# Patient Record
Sex: Female | Born: 2000 | Race: Black or African American | Hispanic: No | Marital: Single | Smoking: Never smoker
Health system: Southern US, Community
[De-identification: ages and names within clinical notes are randomized; demographics above are authoritative.]

## PROBLEM LIST (undated history)

## (undated) DIAGNOSIS — E109 Type 1 diabetes mellitus without complications: Secondary | ICD-10-CM

## (undated) DIAGNOSIS — L309 Dermatitis, unspecified: Secondary | ICD-10-CM

## (undated) HISTORY — DX: Dermatitis, unspecified: L30.9

## (undated) HISTORY — PX: NO PAST SURGERIES: SHX2092

## (undated) HISTORY — DX: Type 1 diabetes mellitus without complications: E10.9

---
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
## 2019-01-11 ENCOUNTER — Ambulatory Visit (INDEPENDENT_AMBULATORY_CARE_PROVIDER_SITE_OTHER): Payer: Medicaid Other | Admitting: Pediatrics

## 2019-01-24 ENCOUNTER — Ambulatory Visit (INDEPENDENT_AMBULATORY_CARE_PROVIDER_SITE_OTHER): Payer: Medicaid Other | Admitting: Pediatrics

## 2019-02-16 ENCOUNTER — Ambulatory Visit (INDEPENDENT_AMBULATORY_CARE_PROVIDER_SITE_OTHER): Payer: Medicaid Other | Admitting: Pediatrics

## 2019-02-28 ENCOUNTER — Ambulatory Visit (INDEPENDENT_AMBULATORY_CARE_PROVIDER_SITE_OTHER): Payer: Medicaid Other | Admitting: Pediatrics

## 2019-02-28 ENCOUNTER — Encounter (INDEPENDENT_AMBULATORY_CARE_PROVIDER_SITE_OTHER): Payer: Self-pay | Admitting: Pediatrics

## 2019-02-28 VITALS — BP 112/64 | HR 72 | Ht 58.78 in | Wt 144.2 lb

## 2019-02-28 DIAGNOSIS — E65 Localized adiposity: Secondary | ICD-10-CM | POA: Diagnosis not present

## 2019-02-28 DIAGNOSIS — IMO0001 Reserved for inherently not codable concepts without codable children: Secondary | ICD-10-CM

## 2019-02-28 DIAGNOSIS — Z794 Long term (current) use of insulin: Secondary | ICD-10-CM

## 2019-02-28 DIAGNOSIS — E1065 Type 1 diabetes mellitus with hyperglycemia: Secondary | ICD-10-CM

## 2019-02-28 LAB — POCT GLYCOSYLATED HEMOGLOBIN (HGB A1C): Hemoglobin A1C: 7.8 % — AB (ref 4.0–5.6)

## 2019-02-28 LAB — POCT GLUCOSE (DEVICE FOR HOME USE): POC GLUCOSE: 263 mg/dL — AB (ref 70–99)

## 2019-02-28 NOTE — Patient Instructions (Addendum)
It was a pleasure to see you in clinic today.   Feel free to contact our office during normal business hours at [phone] with questions or concerns. If you need [place] urgently after normal business hours, please call the above number to reach our answering service who will contact the on-call pediatric endocrinologist.  If you choose to communicate with [place] via MyChart, please do not send urgent messages as this inbox is NOT monitored on nights or weekends.  Urgent concerns should be discussed with the on-call pediatric endocrinologist.  -Always have fast sugar with you in case of low blood sugar (glucose tabs, regular juice or soda, candy) -Always wear your ID that states you have diabetes -Always bring your meter/continuous glucose monitor to your visit -Call/Email if you want to review blood sugars   I will put in a referral to the adult endocrinologist- Dr. Carlus Pavlov  Dr. Carlus Pavlov Internal medicine - endocrinology, diabetes & metabolism  [address] [place], [place], [place] [address]  [phone] - [phone]  Increase tresiba to 27 units daily Increase novolog to 1 unit for every 8 grams carbs at all meals.

## 2019-02-28 NOTE — Progress Notes (Signed)
Pediatric Endocrinology Consultation Follow-Up Visit  Chloe Peters, Chloe Peters [birth year]  [name], MD   Chief Complaint: T1DM  HPI: Chloe Peters  is a 18 y.o. female presenting for follow-up of type 1 DM.  she attended this visit alone.    1. Chloe [facility] in 04/2018 for transfer of care for T1DM.  She was diagnosed with T1DM at age 66 years and reported several DKA admissions in the past.   2. Since last visit [facility] on 10/04/18, Chloe Peters has been well overall.  No ED visits or hospitalizations.  Concerns: -classes are more stressful this semester -BGs higher since going back to school, not seeing as many lows (except past 2 days) -Wants to transition to adult endocrine.  Plans to go on mom's insurance when she turns 18 and her medicaid expires  Insulin regimen:  Tresiba 25 units at [employer] 1 unit for every 9 g CHO at BF, 1:8 at lunch, 1:9 at dinner, 1 unit for every 30mg /dl of BG >758 [place] before meals BF: 5-6 units Novolog total L: 8 units Novolog total D: 5-6 units Novolog total Hypoglycemia: Able to feel most low blood sugars, none recently.  No glucagon needed recently.  Blood glucose download:  Using dexcom only  CGM download: Dexcom G6 Avg BG: 244 High 82% of the time, In range 18% of the time, low 0% of the time Patterns: Overnight starts around 300-400, gives correction every 2 hours, drops to 150s by 6AM and remains here until 9AM, then climbs to 250 for the remainder of the day  Injection sites: Legs, lateral abdomen (avoiding arms and bumps on abdomen) Annual labs due:  Due for annual screening labs 09/2019 Ophthalmology due: Appt scheduled in June.   ROS:  All systems reviewed with pertinent positives listed below; otherwise negative. Constitutional: Weight down 6lb, eating same, negative celiac panel in 04/2018.  Sleeping well HEENT: No vision problems  lately when wearing glasses Respiratory: No increased work of breathing currently GI: Hx of constipation.  Was seen by adult GI who performed a colonoscopy and reported irritation to started her on linzess in addition to miralax.  She has not noted any improvement since linzess. GU: Plans to see gyn soon to discuss birth control to improve cramps Musculoskeletal: No joint deformity Neuro: Normal affect Endocrine: As above  Past Medical History:  Past Medical History:  Diagnosis Date  . Type 1 diabetes (HCC)    Dx at age 66 years    Meds: Outpatient Encounter Medications as of 02/28/2019  Medication Sig  . ACCU-CHEK FASTCLIX LANCETS MISC Check sugar 10 x daily  . Clindamycin-Benzoyl Per, Refr, gel APPLY TO CLEAN AND DRY FACE EVERY MORNING  . Glucagon (BAQSIMI TWO PACK) 3 MG/DOSE POWD Place 1 application into the nose as needed. Use as directed if unconscious, unable to take food po, or having a seizure due to hypoglycemia  . glucagon 1 MG injection Inject 1 mg IM into thigh for severe Hypoglycemia and patient unconscious and cannot eat or drink  . glucose blood (ACCU-CHEK GUIDE) test strip Use to check BG 6 times daily  . hydrocortisone-pramoxine (ANALPRAM-HC) 2.5-1 % rectal cream APPLY TO AFFECTED AREA 3 TIMES A DAY AS NEEDED  . insulin aspart (NOVOLOG FLEXPEN) 100 UNIT/ML FlexPen Use as directed by MD, up to 50 units daily.  . insulin degludec (TRESIBA FLEXTOUCH) 100 UNIT/ML SOPN FlexTouch Pen Inject up to 50 units at bedtime and per care plan  .  Insulin Pen Needle (BD PEN NEEDLE NANO U/F) 32G X 4 MM MISC use with insulin pen to inject up to 6 times daily  . loratadine (CLARITIN) 10 MG tablet   . Polyethylene Glycol 3350 (PEG 3350) POWD Take by mouth.  . insulin aspart (NOVOLOG) 100 UNIT/ML injection Up to 300 units of insulin in insulin pump every 48-72 hours per DKA and Hyperglycemia protocols (Patient not taking: Reported on 02/28/2019)   No facility-administered encounter medications  on file as of 02/28/2019.     Allergies: Allergies  Allergen Reactions  . Shellfish Allergy Anaphylaxis   Surgical History: No past surgical history on file.  Hospitalized at age 74 for DKA with diabetes diagnosis Several prior hospitalizations for DKA, most recent 07/02/18  Family History:  Family History  Problem Relation Age of Onset  . Diabetes Paternal Aunt   . 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 Blood pressure percentiles are not available for patients who are 18 years or older.  Wt Readings from Last 3 Encounters:  02/28/19 144 lb 3.2 oz (65.4 kg) (78 %, Z= 0.76)*  10/04/18 150 lb 6.4 oz (68.2 kg) (84 %, Z= 0.99)*  07/05/18 156 lb 6.4 oz (70.9 kg) (88 %, Z= 1.18)*   * Growth percentiles are based on CDC (Girls, 2-20 Years) data.   Ht Readings from Last 3 Encounters:  02/28/19 4' 10.78" (1.493 m) (2 %, Z= -2.14)*  10/04/18 5' 2.8" (1.595 m) (29 %, Z= -0.57)*  07/05/18 5' 3.58" (1.615 m) (40 %, Z= -0.25)*   * Growth percentiles are based on CDC (Girls, 2-20 Years) data.   Body mass index is 29.34 kg/m.  78 %ile (Z= 0.76) based on CDC (Girls, 2-20 Years) weight-for-age data using vitals from 02/28/2019. 2 %ile (Z= -2.14) based on CDC (Girls, 2-20 Years) Stature-for-age data based on Stature recorded on 02/28/2019.  General: Well developed, well nourished female in no acute distress.  Appears stated age Head: Normocephalic, atraumatic.   Eyes:  Pupils equal and round. EOMI.   Sclera white.  No eye drainage.   Ears/Nose/Mouth/Throat: Nares patent, no nasal drainage.  Normal dentition, mucous membranes  moist.   Neck: supple, no cervical lymphadenopathy, no thyromegaly Cardiovascular: regular rate, normal S1/S2, no murmurs Respiratory: No increased work of breathing.  Lungs clear to auscultation bilaterally.  No wheezes. Abdomen: soft, nondistended, reports slightly uncomfortable feeling on palpation  Extremities: warm, well perfused, cap refill < 2 sec.   Musculoskeletal: Normal muscle mass.  Normal strength Skin: warm, dry.  No rash. 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 Range 05/24/2018 00:00  Total CHOL/HDL Ratio Latest Ref Range: <5.0 (calc) 3.4  Cholesterol Latest Ref Range: <170 mg/dL 793 (H)  HDL Cholesterol Latest Ref Range: >45 mg/dL 72  LDL Cholesterol (Calc) Latest Ref Range: <110 mg/dL (calc) 903 (H)  MICROALB/CREAT RATIO Latest Ref Range: <30 mcg/mg creat 60 (H)  Non-HDL Cholesterol (Calc) Latest Ref Range: <120 mg/dL (calc) 009 (H)  Triglycerides Latest Ref Range: <90 mg/dL 71  TSH Latest Units: mIU/L 0.58  T4,Free(Direct) Latest Ref Range: 0.8 - 1.4 ng/dL 1.1  Immunoglobulin A Latest Ref Range: 81 - 463 mg/dL 233  (tTG) Ab, IgA Latest Units: U/mL 3  Microalb, Ur Latest Units: mg/dL 2.9  Creatinine, Urine Latest Ref Range: 20 - 275 mg/dL 48     Ref. Range 10/04/2018 00:00  Total CHOL/HDL Ratio Latest Ref Range: <5.0 (calc) 2.8  Cholesterol Latest Ref Range: <170 mg/dL 007  HDL Cholesterol Latest Ref Range: >45 mg/dL 59  LDL Cholesterol (Calc) Latest Ref Range: <110  mg/dL (calc) 89  MICROALB/CREAT RATIO Latest Ref Range: <30 mcg/mg creat 8  Non-HDL Cholesterol (Calc) Latest Ref Range: <120 mg/dL (calc) 622  Triglycerides Latest Ref Range: <90 mg/dL 67  Microalb, Ur Latest Units: mg/dL 0.2  Creatinine, Urine Latest Ref Range: 20 - 275 mg/dL 26    Assessment/Plan: Chloe Peters is a 18 y.o. with Type 1 diabetes in good and improving control on an MDI and CGM regimen.  A1c has decreased since last visit though remains just above the ADA goal of <7.5%.  she would benefit from increased basal insulin and increased novolog at lunch and dinner.    When a patient is on insulin, intensive monitoring of blood glucose levels and continuous insulin titration is vital to avoid insulin toxicity leading to severe hypoglycemia. Severe hypoglycemia can lead to seizure or death. Hyperglycemia can also result from inadequate insulin dosing and can lead to ketosis requiring ICU admission and intravenous insulin.   1. Uncontrolled diabetes mellitus type 1 without complications (HCC) - POCT Glucose and POCT HgB A1C as above -Provided with my contact information and advised to email/send mychart with questions/need for BG review -Will refer to Dr. Elvera Lennox for transition of care.  Next appt needed in 3 months.  Advised to call my office if she needs anything prior to transfer to Dr. Elvera Lennox  2. Insulin dose change -Made the following insulin changes: Increase tresiba to 27 units daily Increase carb ratio to 1:8 at all meals  3. Lipohypertrophy -Avoid lipohypertrophied sites   Follow-up:  Return if symptoms worsen or fail to improve. Transfer care to adult endocrine  Level of Service: This visit lasted in excess of 25 minutes. More than 50% of the visit was devoted to counseling.  [name], MD

## 2019-03-06 ENCOUNTER — Other Ambulatory Visit (INDEPENDENT_AMBULATORY_CARE_PROVIDER_SITE_OTHER): Payer: Self-pay | Admitting: Pediatrics

## 2019-03-06 DIAGNOSIS — E1065 Type 1 diabetes mellitus with hyperglycemia: Principal | ICD-10-CM

## 2019-03-06 DIAGNOSIS — IMO0001 Reserved for inherently not codable concepts without codable children: Secondary | ICD-10-CM

## 2019-03-09 ENCOUNTER — Telehealth (INDEPENDENT_AMBULATORY_CARE_PROVIDER_SITE_OTHER): Payer: Self-pay | Admitting: Pediatrics

## 2019-03-09 NOTE — Telephone Encounter (Signed)
°  Who's calling (name and relationship to patient) : Adelma (Self)  Best contact number: [phone] Provider they see: Dr. Larinda Buttery  Reason for call: Pt would like a return call from clinic.

## 2019-03-10 ENCOUNTER — Telehealth (INDEPENDENT_AMBULATORY_CARE_PROVIDER_SITE_OTHER): Payer: Self-pay | Admitting: "Endocrinology

## 2019-03-10 NOTE — Telephone Encounter (Signed)
Spoke to patient, she advises she went to doctor yesterday and was diagnosed with Flu A. She would like to know how to respond to her high glucose levels. I advise I will send note to [facility]. She asks that he respond to her via Mychart.

## 2019-03-10 NOTE — Telephone Encounter (Signed)
1. Chloe Peters called. She has influenza A that was diagnosed yesterday. She want advice about how to control her high BGs 2. Subjective: She feels bad, but is able to eat and drink. She is not having any nausea, vomiting, or diarrhea. She has urine ketone strips, but has not used them yet. She has forgotten about the Sick Day protocol and the DKA protocol and can't fine the instructions sheets that we gave her previously She is currently taking [place] and Novolog. 3. 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 She may already have ketosis and ketonuria.  4. I reviewed the Sick Day protocol and the DKA protocol with her. I asked her to check her ketones and contact us if they seem to be increasing. I also asked her to contact us if things are not going well. If she develops severe nausea and vomiting, she will have to go to the [facility] ED and may need to be admitted.  Molli Knock

## 2019-03-10 NOTE — Telephone Encounter (Signed)
Dr. Homero Fellers patient.  Routed to Dr. Fransico Michael who is on call.

## 2019-08-09 ENCOUNTER — Other Ambulatory Visit (INDEPENDENT_AMBULATORY_CARE_PROVIDER_SITE_OTHER): Payer: Self-pay

## 2019-08-09 DIAGNOSIS — IMO0001 Reserved for inherently not codable concepts without codable children: Secondary | ICD-10-CM

## 2019-08-09 MED ORDER — BD PEN NEEDLE NANO U/F 32G X 4 MM MISC: 5 refills | Status: DC

## 2019-10-03 ENCOUNTER — Telehealth (INDEPENDENT_AMBULATORY_CARE_PROVIDER_SITE_OTHER): Payer: Self-pay | Admitting: Pediatrics

## 2019-10-03 NOTE — Telephone Encounter (Signed)
Received message in Epic that [place] received her flu shot in a [facility] store.

## 2019-10-30 ENCOUNTER — Other Ambulatory Visit: Payer: Self-pay

## 2019-11-01 ENCOUNTER — Ambulatory Visit: Payer: Medicaid Other | Admitting: Internal Medicine

## 2019-11-02 ENCOUNTER — Ambulatory Visit: Payer: Medicaid Other | Admitting: Internal Medicine

## 2019-11-02 NOTE — Progress Notes (Signed)
Patient ID: Chloe Peters, female   DOB: [birth year], 18 y.o.   MRN: [id number]   HPI: Chloe Peters is a 18 y.o.-year-old female, referred by her pediatric endocrinologist, Dr. Charna Archer, for management of DM1, uncontrolled, without complications.  Patient has been diagnosed with diabetes at 18 years old; she started on insulin at dx.   Last hemoglobin A1c was: Lab Results  Component Value Date   HGBA1C 7.8 (A) 02/28/2019   HGBA1C 8.3 (A) 10/04/2018   HGBA1C 9.2 (A) 05/24/2018   She is on: - Tresiba 27 units in a.m. - NovoLog: Insulin to carb ratio 1:8, target 150, insulin sensitivity factor 30  She checks her sugars more than 4 times a day with her Dexcom G6 CGM.  She also has an Accu-Chek meter.  CGM parameters: - Average from CGM: **  Time in range:  - very low (40-50):  - low (50-70):  - normal range (70-180):  - high sugars (180-250):  - very high sugars (250-400):   No lows. Lowest sugar was **; she has hypoglycemia awareness at 70. No previous hypoglycemia admission. ** a glucagon kit at home. Highest sugar was **. No recent DKA admissions, but had several in the past.  Pt's meals are: - Breakfast: - Lunch: - Dinner: - Snacks:  - no CKD, last BUN/creatinine:  No results found for: BUN, CREATININE   -No HL; last set of lipids: Lab Results  Component Value Date   CHOL 163 10/04/2018   HDL 59 10/04/2018   LDLCALC 89 10/04/2018   TRIG 67 10/04/2018   CHOLHDL 2.8 10/04/2018   - last eye exam was in **. No DR.  - no numbness and tingling in her feet.  No history of hypothyroidism.  Last TSH: Lab Results  Component Value Date   TSH 0.58 05/24/2018   She had a negative celiac panel in 04/2018.  Pt has FH of DM in **.  ROS: Constitutional: no weight gain, no weight loss, no fatigue, no subjective hyperthermia, no subjective hypothermia, no nocturia Eyes: no blurry vision, no xerophthalmia ENT: no sore throat, no nodules palpated in neck, no dysphagia, no  odynophagia, no hoarseness, no tinnitus, no hypoacusis Cardiovascular: no CP, no SOB, no palpitations, no leg swelling Respiratory: no cough, no SOB, no wheezing Gastrointestinal: no N, no V, no D, no C, no acid reflux Musculoskeletal: no muscle, no joint aches Skin: no rash, no hair loss Neurological: no tremors, no numbness or tingling/no dizziness/no HAs Psychiatric: no depression, no anxiety  PE: There were no vitals taken for this visit. Wt Readings from Last 3 Encounters:  02/28/19 144 lb 3.2 oz (65.4 kg) (78 %, Z= 0.76)*  10/04/18 150 lb 6.4 oz (68.2 kg) (84 %, Z= 0.99)*  07/05/18 156 lb 6.4 oz (70.9 kg) (88 %, Z= 1.18)*   * Growth percentiles are based on CDC (Girls, 2-20 Years) data.   Constitutional: normal weight, in NAD Eyes: PERRLA, EOMI, no exophthalmos ENT: moist mucous membranes, no thyromegaly, no cervical lymphadenopathy Cardiovascular: RRR, No MRG Respiratory: CTA B Gastrointestinal: abdomen soft, NT, ND, BS+ Musculoskeletal: no deformities, strength intact in all 4 Skin: moist, warm, no rashes Neurological: no tremor with outstretched hands, DTR normal in all 4  ASSESSMENT: 1. DM1, uncontrolled, without complications  PLAN:  1. Patient with long-standing, uncontrolled DM1, on basal-bolus insulin regimen.  Latest HbA1c was reviewed from 02/2019: 7.8%, improved.  At today's visit, HbA1c is **. - We discussed about changes to his insulin regimen, as  follows:  There are no Patient Instructions on file for this visit. - Strongly advised her to start checking sugars at different times of the day - check at least 4 times a day, rotating checks - given sugar log and advised how to fill it and to bring it at next appt  - given foot care handout and explained the principles  - given instructions for hypoglycemia management "15-15 rule"  - advised for yearly eye exams - sent glucagon kit Rx to pharmacy - advised to get ketone strips - advised to always have Glu  tablets with her - advised for a Med-alert bracelet mentioning "type 1 diabetes mellitus". - will refer to DM education for further help with the pump: carb counting check, basal rate validation, extended bolusing, sick days rules, etc. - given instruction Re: exercising and driving in DM1 (pt instructions) - no signs of other autoimmune disorders - Return to clinic in 1 mo with sugar log   - time spent with the patient: **, of which >50% was spent in obtaining information about herdisease, reviewing previous labs, office visit notes, hospitalization records, and DM treatments, counseling pt about her condition (please see the discussed topics above), and developing a plan to prevent further hypoglycemia and hyperglycemia. We also discussed about proper diet. Pt had a number of questions which I addressed.  Philemon Kingdom, MD PhD [facility]

## 2019-11-03 ENCOUNTER — Encounter: Payer: Self-pay | Admitting: Internal Medicine

## 2019-11-17 ENCOUNTER — Ambulatory Visit: Payer: Medicaid Other | Admitting: Internal Medicine

## 2019-12-05 ENCOUNTER — Encounter: Payer: Self-pay | Admitting: Internal Medicine

## 2019-12-08 ENCOUNTER — Ambulatory Visit: Payer: Medicaid Other | Admitting: Internal Medicine

## 2020-02-16 ENCOUNTER — Other Ambulatory Visit (INDEPENDENT_AMBULATORY_CARE_PROVIDER_SITE_OTHER): Payer: Self-pay | Admitting: Pediatrics

## 2020-03-24 ENCOUNTER — Other Ambulatory Visit (INDEPENDENT_AMBULATORY_CARE_PROVIDER_SITE_OTHER): Payer: Self-pay | Admitting: Pediatrics

## 2020-05-07 ENCOUNTER — Other Ambulatory Visit: Payer: Self-pay

## 2020-05-08 ENCOUNTER — Ambulatory Visit: Payer: Medicaid Other | Admitting: Nurse Practitioner

## 2020-09-04 ENCOUNTER — Ambulatory Visit (INDEPENDENT_AMBULATORY_CARE_PROVIDER_SITE_OTHER): Payer: No Typology Code available for payment source | Admitting: Allergy

## 2020-09-04 ENCOUNTER — Other Ambulatory Visit: Payer: Self-pay

## 2020-09-04 ENCOUNTER — Encounter: Payer: Self-pay | Admitting: Allergy

## 2020-09-04 VITALS — BP 118/72 | HR 93 | Temp 98.2°F | Resp 16 | Ht 63.5 in | Wt 135.8 lb

## 2020-09-04 DIAGNOSIS — T7800XD Anaphylactic reaction due to unspecified food, subsequent encounter: Secondary | ICD-10-CM

## 2020-09-04 DIAGNOSIS — J301 Allergic rhinitis due to pollen: Secondary | ICD-10-CM | POA: Diagnosis not present

## 2020-09-04 DIAGNOSIS — H1013 Acute atopic conjunctivitis, bilateral: Secondary | ICD-10-CM

## 2020-09-04 DIAGNOSIS — L2089 Other atopic dermatitis: Secondary | ICD-10-CM

## 2020-09-04 MED ORDER — OLOPATADINE HCL 0.2 % OP SOLN: OPHTHALMIC | 5 refills | Status: AC

## 2020-09-04 MED ORDER — AZELASTINE HCL 0.1 % NA SOLN: 2.0000 | Freq: Two times a day (BID) | NASAL | 5 refills | Status: DC | PRN

## 2020-09-04 MED ORDER — DESONIDE 0.05 % EX OINT: 1.0000 "application " | TOPICAL_OINTMENT | Freq: Two times a day (BID) | CUTANEOUS | 5 refills | Status: DC | PRN

## 2020-09-04 NOTE — Patient Instructions (Addendum)
Eczema, flexural  - Bathe and soak for 5-10 minutes in warm water once a day. Pat dry.  Immediately apply the below cream prescribed to flared areas (dry, itchy, patchy, irritated, red areas) only. Wait 5-10 minutes and then apply moisturizer like Cetaphil all over.  To affected areas on the face and neck, apply: . Desonide 0.05% ointment twice a day as needed. . Be careful to avoid the eyes.  -Make a note of any foods that make eczema worse. -Keep finger nails trimmed. - common food allergy skin testing is negative - use scent free and dye free soaps, body products and detergents  Environmental allergies - environmental allergy skin testing is positive to grass, weed and tree pollens - allergen avoidance measures discussed/handouts provided - continue Claritin 10mg  daily as needed.  If Claritin becomes less effective then can try either Zyrtec 10mg , Allegra 180mg  or Xyzal 5mg  daily as needed - for itchy/watery eyes can use Olopatadine 0.2% 1 drop each eye daily as needed - for nasal drainage and post-nasal drip recommend use of nasal antihistamine, Astelin 2 sprays each nostril 1-2 times a day as needed - if medication management is not effective then consider restarting allergen immunotherapy (allergy shots)  Food allergy  - skin testing to shellfish is positive to lobster and oyster  - continue avoidance of shellfish.  If you become interested in eating shellfish or seeing if you are not allergic anymore then would recommend obtain labwork for serum IgE to shellfish panel.   - have access to self-injectable epinephrine (Epipen or AuviQ) 0.3mg  at all times  - follow emergency action plan in case of allergic reaction  Follow-up in 4-6 months or sooner if needed

## 2020-09-04 NOTE — Progress Notes (Signed)
New Patient Note  RE: Chloe Peters MRN: [id number] DOB: [birth year] Date of Office Visit: 09/04/2020  Referring provider: Norm Salt, PA Primary care provider: Norm Salt, [place]  Chief Complaint: itchy rash  History of present illness: Chloe Peters is a 19 y.o. female presenting today for consultation for rash.  She has history of food allergy to shellfish.    She has been having a itchy rash on her neck that started on back of neck in July 2021.  She went to her PCP at that time and states was told it was dermatitis.  She states she would scratch and rash would spread.  Lately she has been seeing the rash on the front of her neck now.  She states the rash is itchy, dry and patchy.  She states her mother has told her she has had similar rash in the past.  She does take hydroxyzine now most nights to help with itch. She also uses hydrocortisone that also helps.  Moisturizes and baths with Cetaphil and Curology brand so.  Does not wear jewelry, does not use perfumes.  No change in hair products.  Rash is only on neck.    When she was around 19 years old she was eating crab and her throat started to get itchy and had difficulty breathing and vomiting. Has been avoiding shellfish since.  Eats fish without issue.  Has access to an epinephrine device; has never needed to use it.   She reports dust, pollen triggers runny nose with cough, itchy/watery eyes, sneezing, sometimes will develop hives as well.  Symptoms now are year-round.  Takes claritin daily for years.  She does use Flonase and has used OTC allergy eyedrops.   She was on allergy shots for about a year but family moved and was discontinued. She states she could tell a difference in symptom control during that year.  This was almost 10 years ago.   Review of systems: Review of Systems  Constitutional: Negative.   HENT:       See HPI  Eyes:       See HPI  Respiratory: Negative.   Cardiovascular: Negative.     Gastrointestinal: Negative.   Musculoskeletal: Negative.   Skin:       See HPI  Neurological: Negative.     All other systems negative unless noted above in HPI  Past medical history: Past Medical History:  Diagnosis Date  . Type 1 diabetes (HCC)    Dx at age 59 years    Past surgical history: Past Surgical History:  Procedure Laterality Date  . NO PAST SURGERIES      Family history:  Family History  Problem Relation Age of Onset  . Allergic rhinitis Mother   . Diabetes Paternal Aunt   . Diabetes type I Maternal Grandmother   . Angioedema Neg Hx   . Asthma Neg Hx   . Eczema Neg Hx   . Atopy Neg Hx   . Immunodeficiency Neg Hx   . Urticaria Neg Hx     Social history: Lives in an apartment with carpeting with electric heating and central cooling. No pets in the home. There are cats and dogs outside the home. No concern for water damage, mildew or roaches in the home. She is a Physicist, medical. Denies a smoking history.  Medication List: Current Outpatient Medications  Medication Sig Dispense Refill  . ACCU-CHEK FASTCLIX LANCETS MISC Check sugar 10 x daily 306 each 3  .  Clindamycin-Benzoyl Per, Refr, gel APPLY TO CLEAN AND DRY FACE EVERY MORNING  3  . Glucagon (BAQSIMI TWO PACK) 3 MG/DOSE POWD Place 1 application into the nose as needed. Use as directed if unconscious, unable to take food po, or having a seizure due to hypoglycemia 2 each 1  . glucagon 1 MG injection Inject 1 mg IM into thigh for severe Hypoglycemia and patient unconscious and cannot eat or drink 2 each 1  . glucose blood (ACCU-CHEK GUIDE) test strip Use to check BG 6 times daily 200 each 8  . hydrocortisone-pramoxine (ANALPRAM-HC) 2.5-1 % rectal cream APPLY TO AFFECTED AREA 3 TIMES A DAY AS NEEDED    . hydrOXYzine (ATARAX/VISTARIL) 25 MG tablet TAKE 1 TABLET BY MOUTH EVERYDAY AT BEDTIME    . insulin aspart (NOVOLOG) 100 UNIT/ML FlexPen USE AS DIRECTED BY MD, UP TO 50 UNITS DAILY. 15 mL 4  . insulin  degludec (TRESIBA FLEXTOUCH) 100 UNIT/ML SOPN FlexTouch Pen Inject up to 50 units at bedtime and per care plan 45 mL 1  . Insulin Pen Needle (BD PEN NEEDLE NANO U/F) 32G X 4 MM MISC use with insulin pen to inject up to 6 times daily 200 each 5  . loratadine (CLARITIN) 10 MG tablet   5  . Polyethylene Glycol 3350 (PEG 3350) POWD Take by mouth.     No current facility-administered medications for this visit.    Known medication allergies: Allergies  Allergen Reactions  . Shellfish Allergy Anaphylaxis     Physical examination: Blood pressure 118/72, pulse 93, temperature 98.2 F (36.8 C), temperature source Temporal, resp. rate 16, height 5' 3.5" (1.613 m), weight 135 lb 12.8 oz (61.6 kg), SpO2 99 %.  General: Alert, interactive, in no acute distress. HEENT: PERRLA, TMs pearly gray, turbinates minimally edematous without discharge, post-pharynx non erythematous. Neck: Supple without lymphadenopathy. Lungs: Clear to auscultation without wheezing, rhonchi or rales. {no increased work of breathing. CV: Normal S1, S2 without murmurs. Abdomen: Nondistended, nontender. Skin: Warm and dry, without lesions or rashes. No visible rash on exam Extremities:  No clubbing, cyanosis or edema. Neuro:   Grossly intact.  Diagnositics/Labs:  Allergy testing: Environmental allergy skin prick testing is positive to [place], [employer], rough [place], [place], [place], [place], [place]. 10 common food allergen skin prick testing is negative. Shellfish skin prick testing is positive for lobster and oyster. Allergy testing results were read and interpreted by provider, documented by clinical staff.   Assessment and plan:   Eczema, flexural  - Bathe and soak for 5-10 minutes in warm water once a day. Pat dry.  Immediately apply the below cream prescribed to flared areas (dry, itchy, patchy, irritated, red areas) only. Wait 5-10 minutes and then apply moisturizer like Cetaphil all over.  To affected  areas on the face and neck, apply: . Desonide 0.05% ointment twice a day as needed. . Be careful to avoid the eyes.  -Make a note of any foods that make eczema worse. -Keep finger nails trimmed. - common food allergy skin testing is negative - use scent free and dye free soaps, body products and detergents  Allergic rhinitis with conjunctivitis - environmental allergy skin testing is positive to grass, weed and tree pollens - allergen avoidance measures discussed/handouts provided - continue Claritin 10mg  daily as needed.  If Claritin becomes less effective then can try either Zyrtec 10mg , Allegra 180mg  or Xyzal 5mg  daily as needed - for itchy/watery eyes can use Olopatadine 0.2% 1 drop each eye daily as needed -  for nasal drainage and post-nasal drip recommend use of nasal antihistamine, Astelin 2 sprays each nostril 1-2 times a day as needed - if medication management is not effective then consider restarting allergen immunotherapy (allergy shots)  Anaphylaxis due to food  - skin testing to shellfish is positive to lobster and oyster  - continue avoidance of shellfish.  If you become interested in eating shellfish or seeing if you are not allergic anymore then would recommend obtain labwork for serum IgE to shellfish panel.   - have access to self-injectable epinephrine (Epipen or AuviQ) 0.3mg  at all times  - follow emergency action plan in case of allergic reaction  Follow-up in 4-6 months or sooner if needed   I appreciate the opportunity to take part in Rocquel's care. Please do not hesitate to contact me with questions.  Sincerely,   Margo Aye, MD Allergy/Immunology [facility]

## 2020-11-01 ENCOUNTER — Other Ambulatory Visit: Payer: Medicaid Other

## 2020-11-01 DIAGNOSIS — Z20822 Contact with and (suspected) exposure to covid-19: Secondary | ICD-10-CM

## 2020-11-02 LAB — NOVEL CORONAVIRUS, NAA: SARS-CoV-2, NAA: NOT DETECTED

## 2020-11-02 LAB — SARS-COV-2, NAA 2 DAY TAT

## 2020-11-03 ENCOUNTER — Ambulatory Visit (HOSPITAL_COMMUNITY)
Admission: RE | Admit: 2020-11-03 | Discharge: 2020-11-03 | Disposition: A | Discharge status: Home / Self Care | Payer: Medicaid Other | Source: Ambulatory Visit | Attending: Physician Assistant | Admitting: Physician Assistant

## 2020-11-03 ENCOUNTER — Other Ambulatory Visit: Payer: Self-pay

## 2020-11-03 ENCOUNTER — Encounter (HOSPITAL_COMMUNITY): Payer: Self-pay

## 2020-11-03 VITALS — BP 129/78 | HR 116 | Temp 99.3°F | Resp 18 | Ht 63.0 in | Wt 128.0 lb

## 2020-11-03 DIAGNOSIS — R059 Cough, unspecified: Secondary | ICD-10-CM

## 2020-11-03 DIAGNOSIS — R0981 Nasal congestion: Secondary | ICD-10-CM | POA: Diagnosis not present

## 2020-11-03 DIAGNOSIS — E1065 Type 1 diabetes mellitus with hyperglycemia: Secondary | ICD-10-CM | POA: Insufficient documentation

## 2020-11-03 DIAGNOSIS — Z79899 Other long term (current) drug therapy: Secondary | ICD-10-CM | POA: Insufficient documentation

## 2020-11-03 DIAGNOSIS — J069 Acute upper respiratory infection, unspecified: Secondary | ICD-10-CM | POA: Diagnosis not present

## 2020-11-03 DIAGNOSIS — Z20822 Contact with and (suspected) exposure to covid-19: Secondary | ICD-10-CM | POA: Diagnosis not present

## 2020-11-03 DIAGNOSIS — E109 Type 1 diabetes mellitus without complications: Secondary | ICD-10-CM

## 2020-11-03 DIAGNOSIS — Z794 Long term (current) use of insulin: Secondary | ICD-10-CM | POA: Insufficient documentation

## 2020-11-03 LAB — CBG MONITORING, ED: Glucose-Capillary: 297 mg/dL — ABNORMAL HIGH (ref 70–99)

## 2020-11-03 LAB — SARS CORONAVIRUS 2 (TAT 6-24 HRS): SARS Coronavirus 2: NEGATIVE

## 2020-11-03 LAB — POCT RAPID STREP A, ED / UC: Streptococcus, Group A Screen (Direct): NEGATIVE

## 2020-11-03 NOTE — ED Triage Notes (Signed)
CBG 297 

## 2020-11-03 NOTE — ED Triage Notes (Addendum)
Pt reports runny nose and cough since Thursday, Pt's PCP called in Meds but no relief of Sx'x. Pt also reports Lt neck pain. Pt A/O on arrival to room . PT reports CBG at 1400 today was 440

## 2020-11-03 NOTE — ED Provider Notes (Signed)
[facility]   MRN: [id number] DOB: 13-Jan-2001  Subjective:   Chloe Peters is a 19 y.o. female presenting for 3-day history of acute onset runny and stuffy nose, cough, ear pain, subjective fever, left shoulder pain and general malaise.  Patient states that she contacted her PCPs office, they did a COVID-19 test and was negative.  They did not see her in the clinic but did a televisit and ended up prescribing azithromycin, prednisone, cough medications, and an albuterol inhaler.  Patient states that none of these medications have helped.  Patient is a type I diabetic and is uncontrolled.  Denies any history of lung conditions, asthma, respiratory disorders.  No current facility-administered medications for this encounter.  Current Outpatient Medications:    ACCU-CHEK FASTCLIX LANCETS MISC, Check sugar 10 x daily, Disp: 306 each, Rfl: 3   albuterol (VENTOLIN HFA) 108 (90 Base) MCG/ACT inhaler, Inhale 108 each into the lungs as needed., Disp: , Rfl:    azelastine (ASTELIN) 0.1 % nasal spray, Place 2 sprays into both nostrils 2 (two) times daily as needed. Use in each nostril as directed, Disp: 30 mL, Rfl: 5   azithromycin (ZITHROMAX) 250 MG tablet, Take 250 mg by mouth as directed., Disp: , Rfl:    benzonatate (TESSALON) 200 MG capsule, Take 200 mg by mouth as needed., Disp: , Rfl:    Clindamycin-Benzoyl Per, Refr, gel, APPLY TO CLEAN AND DRY FACE EVERY MORNING, Disp: , Rfl: 3   desonide (DESOWEN) 0.05 % ointment, Apply 1 application topically 2 (two) times daily as needed., Disp: 60 g, Rfl: 5   Glucagon (BAQSIMI TWO PACK) 3 MG/DOSE POWD, Place 1 application into the nose as needed. Use as directed if unconscious, unable to take food po, or having a seizure due to hypoglycemia, Disp: 2 each, Rfl: 1   glucagon 1 MG injection, Inject 1 mg IM into thigh for severe Hypoglycemia and patient unconscious and cannot eat or drink, Disp: 2 each, Rfl: 1   glucose blood  (ACCU-CHEK GUIDE) test strip, Use to check BG 6 times daily, Disp: 200 each, Rfl: 8   hydrOXYzine (ATARAX/VISTARIL) 25 MG tablet, TAKE 1 TABLET BY MOUTH EVERYDAY AT BEDTIME, Disp: , Rfl:    insulin aspart (NOVOLOG) 100 UNIT/ML FlexPen, USE AS DIRECTED BY MD, UP TO 50 UNITS DAILY., Disp: 15 mL, Rfl: 4   insulin degludec (TRESIBA FLEXTOUCH) 100 UNIT/ML SOPN FlexTouch Pen, Inject up to 50 units at bedtime and per care plan, Disp: 45 mL, Rfl: 1   Insulin Pen Needle (BD PEN NEEDLE NANO U/F) 32G X 4 MM MISC, use with insulin pen to inject up to 6 times daily, Disp: 200 each, Rfl: 5   loratadine (CLARITIN) 10 MG tablet, , Disp: , Rfl: 5   Olopatadine HCl 0.2 % SOLN, 1 drop in each eye daily as needed, Disp: 2.5 mL, Rfl: 5   Polyethylene Glycol 3350 (PEG 3350) POWD, Take by mouth., Disp: , Rfl:    hydrocortisone-pramoxine (ANALPRAM-HC) 2.5-1 % rectal cream, APPLY TO AFFECTED AREA 3 TIMES A DAY AS NEEDED, Disp: , Rfl:    Allergies  Allergen Reactions   Shellfish Allergy Anaphylaxis    Past Medical History:  Diagnosis Date   Type 1 diabetes (HCC)    Dx at age 19 years     Past Surgical History:  Procedure Laterality Date   NO PAST SURGERIES      Family History  Problem Relation Age of Onset   Allergic rhinitis  Mother    Diabetes Paternal Aunt    Diabetes type I Maternal Grandmother    Angioedema Neg Hx    Asthma Neg Hx    Eczema Neg Hx    Atopy Neg Hx    Immunodeficiency Neg Hx    Urticaria Neg Hx     Social History   Tobacco Use   Smoking status: Never Smoker   Smokeless tobacco: Never Used  Substance Use Topics   Alcohol use: Never   Drug use: Never    ROS   Objective:   Vitals: BP 129/78 (BP Location: Left Arm)    Pulse (!) 116    Temp 99.3 F (37.4 C) (Oral)    Resp 18    Ht 5\' 3"  (1.6 m)    Wt 128 lb (58.1 kg)    LMP 10/29/2020    SpO2 100%    BMI 22.67 kg/m   Physical Exam Constitutional:      General: She is not in acute distress.     Appearance: Normal appearance. She is well-developed. She is not ill-appearing, toxic-appearing or diaphoretic.  HENT:     Head: Normocephalic and atraumatic.     Nose: Nose normal.     Mouth/Throat:     Mouth: Mucous membranes are moist.  Eyes:     Extraocular Movements: Extraocular movements intact.     Pupils: Pupils are equal, round, and reactive to light.  Cardiovascular:     Rate and Rhythm: Normal rate and regular rhythm.     Pulses: Normal pulses.     Heart sounds: Normal heart sounds. No murmur heard.  No friction rub. No gallop.   Pulmonary:     Effort: Pulmonary effort is normal. No respiratory distress.     Breath sounds: Normal breath sounds. No stridor. No wheezing, rhonchi or rales.  Skin:    General: Skin is warm and dry.     Findings: No rash.  Neurological:     Mental Status: She is alert and oriented to person, place, and time.  Psychiatric:        Mood and Affect: Mood normal.        Behavior: Behavior normal.        Thought Content: Thought content normal.     Results for orders placed or performed during the hospital encounter of 11/03/20 (from the past 24 hour(s))  POC CBG, ED     Status: Abnormal   Collection Time: 11/03/20  3:32 PM  Result Value Ref Range   Glucose-Capillary 297 (H) 70 - 99 mg/dL  POCT Rapid Strep A     Status: None   Collection Time: 11/03/20  3:51 PM  Result Value Ref Range   Streptococcus, Group A Screen (Direct) NEGATIVE NEGATIVE    Assessment and Plan :   PDMP not reviewed this encounter.  1. Viral URI with cough   2. Type 1 diabetes mellitus without complication (HCC)     Strep culture pending, will recheck COVID-19 test.  Recommended patient maintain medications prescribed by her regular doctor, have very close follow-up with them in the next 2 days. Counseled patient on potential for adverse effects with medications prescribed/recommended today, ER and return-to-clinic precautions discussed, patient verbalized  understanding.    13/07/21, Wallis Bamberg 11/03/20 1638

## 2020-11-06 LAB — CULTURE, GROUP A STREP (THRC)

## 2020-12-03 ENCOUNTER — Encounter (INDEPENDENT_AMBULATORY_CARE_PROVIDER_SITE_OTHER): Payer: Self-pay | Admitting: Student in an Organized Health Care Education/Training Program

## 2021-02-05 ENCOUNTER — Ambulatory Visit: Payer: Medicaid Other | Admitting: Allergy

## 2021-02-20 ENCOUNTER — Ambulatory Visit: Payer: Medicaid Other | Admitting: Allergy

## 2021-02-28 NOTE — Patient Instructions (Incomplete)
Eczema, flexural  - Bathe and soak for 5-10 minutes in warm water once a day. Pat dry.  Immediately apply the below cream prescribed to flared areas (dry, itchy, patchy, irritated, red areas) only. Wait 5-10 minutes and then apply moisturizer like Cetaphil all over.  To affected areas on the face and neck, apply: . Desonide 0.05% ointment twice a day as needed. . Be careful to avoid the eyes.    Seasonal allergic rhinoconjunctivitis ( grass, weed and tree pollens) - Continue Claritin 10mg  daily as needed.  - Continue olopatadine 0.2% 1 drop each eye daily as needed for itchy watery eyes - Continue  Astelin 2 sprays each nostril 1-2 times a day as needed for runny nose/drainage - if medication management is not effective then consider restarting allergen immunotherapy (allergy shots)  Food allergy (skin testing positive on 09/04/20 to lobster and oyster) Continue to avoid shellfish. In case of an allergic reaction, give Benadryl *** {Blank single:19197::"teaspoonful","teaspoonfuls","capsules"} every {blank single:19197::"4","6"} hours, and if life-threatening symptoms occur, inject with {Blank single:19197::"EpiPen 0.3 mg","EpiPen 0.15 mg","AuviQ 0.3 mg","AuviQ 0.15 mg","AuviQ 0.10 mg"}.   Follow-up in 4-6 months or sooner if needed

## 2021-03-03 ENCOUNTER — Ambulatory Visit: Payer: Medicaid Other | Admitting: Family

## 2021-03-08 NOTE — Patient Instructions (Signed)
Eczema, flexural  - Bathe and soak for 5-10 minutes in warm water once a day. Pat dry.  Immediately apply the below cream prescribed to flared areas (dry, itchy, patchy, irritated, red areas) only. Wait 5-10 minutes and then apply moisturizer like Cetaphil all over.  To affected areas on the face and neck, apply: . Desonide 0.05% ointment twice a day as needed. . Be careful to avoid the eyes.    Seasonal allergic rhinoconjunctivitis ( grass, weed and tree pollens) - Continue Claritin 10mg  daily as needed.  - Continue olopatadine 0.2% 1 drop each eye daily as needed for itchy watery eyes - Continue  Astelin 2 sprays each nostril 1-2 times a day as needed for runny nose/drainage - if medication management is not effective then consider restarting allergen immunotherapy (allergy shots) Schedule an appointment with your eye doctor to get your eyes checked  Food allergy (skin testing positive on 09/04/20 to lobster and oyster) Continue to avoid shellfish. In case of an allergic reaction, give Benadryl 4 teaspoonfuls every 4 hours, and if life-threatening symptoms occur, inject with EpiPen 0.3 mg.   Follow-up in 6 months or sooner if needed

## 2021-03-10 ENCOUNTER — Other Ambulatory Visit: Payer: Self-pay

## 2021-03-10 ENCOUNTER — Ambulatory Visit (INDEPENDENT_AMBULATORY_CARE_PROVIDER_SITE_OTHER): Payer: No Typology Code available for payment source | Admitting: Family

## 2021-03-10 ENCOUNTER — Encounter: Payer: Self-pay | Admitting: Family

## 2021-03-10 VITALS — BP 110/60 | HR 82 | Temp 98.3°F | Resp 18 | Ht 62.0 in | Wt 140.0 lb

## 2021-03-10 DIAGNOSIS — L2089 Other atopic dermatitis: Secondary | ICD-10-CM | POA: Diagnosis not present

## 2021-03-10 DIAGNOSIS — H1013 Acute atopic conjunctivitis, bilateral: Secondary | ICD-10-CM

## 2021-03-10 DIAGNOSIS — J301 Allergic rhinitis due to pollen: Secondary | ICD-10-CM | POA: Diagnosis not present

## 2021-03-10 DIAGNOSIS — T7800XD Anaphylactic reaction due to unspecified food, subsequent encounter: Secondary | ICD-10-CM

## 2021-03-10 MED ORDER — LORATADINE 10 MG PO TABS: 10.0000 mg | ORAL_TABLET | Freq: Every day | ORAL | 5 refills | Status: AC

## 2021-03-10 NOTE — Progress Notes (Signed)
[address] [place] [address] Dept: [phone]  FOLLOW UP NOTE  Patient ID: Chloe Peters, female    DOB: Aug 10, 2001  Age: 20 y.o. MRN: [id number] Date of Office Visit: 03/10/2021  Assessment  Chief Complaint: Eczema  HPI Chloe Peters is a 20 year old female who presents today for follow-up of flexural atopic dermatitis, allergic rhinitis with conjunctivitis, and anaphylaxis due to food.  Flexural atopic dermatitis is reported as controlled with extreme moisture lotion and desonide 0.05% ointment as needed.  She reports that she has not had any flareups of her eczema since her last office visit and has not had to use desonide.  Allergic rhinitis with conjunctivitis is reported as moderately controlled with Claritin 10 mg once a day, olopatadine 0.2% eyedrops as needed, and Astelin nasal spray as needed.  She reports occasional rhinorrhea and since January she has had watery burning eyes at times.  She normally wears glasses.  Her last eye exam was in September 2021.  She has tried using the olopatadine 0.2% eyedrops when she has the watery burning eyes and she reports that this soothes this sensation for a little bit, but then comes back.  She denies any nasal congestion, postnasal drip and has not had any sinus infections since we last saw her.  She continues to avoid shellfish without any accidental ingestion or use of her epinephrine autoinjector device.   Drug Allergies:  Allergies  Allergen Reactions  . Shellfish Allergy Anaphylaxis    Review of Systems: Review of Systems  Constitutional: Negative for chills and fever.  HENT:       Ports occasional rhinorrhea and denies nasal congestion, postnasal drip, and any sinus infections since we last saw her  Eyes:       Reports watery burning eyes at times.  This started in January.  Respiratory: Negative for cough, shortness of breath and wheezing.   Cardiovascular: Negative for chest pain and palpitations.   Gastrointestinal: Positive for abdominal pain. Negative for heartburn.       Seeing a GI specialist soon due to the abdominal pain  Genitourinary: Negative for dysuria.  Skin: Negative for itching and rash.  Neurological: Negative for headaches.  Endo/Heme/Allergies: Positive for environmental allergies.    Physical Exam: BP 110/60 (BP Location: Right Arm, Patient Position: Sitting, Cuff Size: Normal)   Pulse 82   Temp 98.3 F (36.8 C) (Temporal)   Resp 18   Ht 5\' 2"  (1.575 m)   Wt 140 lb (63.5 kg)   SpO2 97%   BMI 25.61 kg/m    Physical Exam Constitutional:      Appearance: Normal appearance.  HENT:     Head: Normocephalic and atraumatic.     Comments: Pharynx normal, eyes normal, ears: Unable to visualize right tympanic membrane due to cerumen.  Left ear normal.  Nose normal    Right Ear: Ear canal and external ear normal.     Left Ear: Tympanic membrane, ear canal and external ear normal.     Nose: Nose normal.     Mouth/Throat:     Mouth: Mucous membranes are moist.     Pharynx: Oropharynx is clear.  Eyes:     Conjunctiva/sclera: Conjunctivae normal.  Cardiovascular:     Rate and Rhythm: Regular rhythm.     Heart sounds: Normal heart sounds.  Pulmonary:     Effort: Pulmonary effort is normal.     Breath sounds: Normal breath sounds.     Comments: Lungs clear to  auscultation Musculoskeletal:     Cervical back: Neck supple.  Skin:    General: Skin is warm.     Comments: No eczematous lesions noted  Neurological:     Mental Status: She is alert and oriented to person, place, and time.  Psychiatric:        Mood and Affect: Mood normal.        Behavior: Behavior normal.        Thought Content: Thought content normal.        Judgment: Judgment normal.     Diagnostics: None  Assessment and Plan: 1. Flexural atopic dermatitis   2. Seasonal allergic rhinitis due to pollen   3. Allergic conjunctivitis of both eyes   4. Allergy with anaphylaxis due to food,  subsequent encounter     Meds ordered this encounter  Medications  . loratadine (CLARITIN) 10 MG tablet    Sig: Take 1 tablet (10 mg total) by mouth daily.    Dispense:  30 tablet    Refill:  5    Patient Instructions  Eczema, flexural  - Bathe and soak for 5-10 minutes in warm water once a day. Pat dry.  Immediately apply the below cream prescribed to flared areas (dry, itchy, patchy, irritated, red areas) only. Wait 5-10 minutes and then apply moisturizer like Cetaphil all over.  To affected areas on the face and neck, apply: . Desonide 0.05% ointment twice a day as needed. . Be careful to avoid the eyes.    Seasonal allergic rhinoconjunctivitis ( grass, weed and tree pollens) - Continue Claritin 10mg  daily as needed.  - Continue olopatadine 0.2% 1 drop each eye daily as needed for itchy watery eyes - Continue  Astelin 2 sprays each nostril 1-2 times a day as needed for runny nose/drainage - if medication management is not effective then consider restarting allergen immunotherapy (allergy shots) Schedule an appointment with your eye doctor to get your eyes checked  Food allergy (skin testing positive on 09/04/20 to lobster and oyster) Continue to avoid shellfish. In case of an allergic reaction, give Benadryl 4 teaspoonfuls every 4 hours, and if life-threatening symptoms occur, inject with EpiPen 0.3 mg.   Follow-up in 6 months or sooner if needed    Return in about 6 months (around 09/10/2021), or if symptoms worsen or fail to improve.    Thank you for the opportunity to care for this patient.  Please do not hesitate to contact me with questions.  09/12/2021, FNP [facility]

## 2021-05-24 ENCOUNTER — Ambulatory Visit (HOSPITAL_COMMUNITY)
Admission: EM | Admit: 2021-05-24 | Discharge: 2021-05-24 | Disposition: A | Discharge status: Home / Self Care | Payer: No Typology Code available for payment source | Attending: Emergency Medicine | Admitting: Emergency Medicine

## 2021-05-24 ENCOUNTER — Encounter (HOSPITAL_COMMUNITY): Payer: Self-pay

## 2021-05-24 DIAGNOSIS — N390 Urinary tract infection, site not specified: Secondary | ICD-10-CM | POA: Diagnosis not present

## 2021-05-24 LAB — POCT URINALYSIS DIPSTICK, ED / UC
Bilirubin Urine: NEGATIVE
Glucose, UA: >=1000 mg/dL — AB
Hgb urine dipstick: NEGATIVE
Ketones, ur: 80 mg/dL — AB
Leukocytes,Ua: NEGATIVE
Nitrite: POSITIVE — AB
Protein, ur: NEGATIVE mg/dL
Specific Gravity, Urine: 1.015 (ref 1.005–1.030)
Urobilinogen, UA: 0.2 mg/dL (ref 0.0–1.0)
pH: 5.5 (ref 5.0–8.0)

## 2021-05-24 LAB — POC URINE PREG, ED: Preg Test, Ur: NEGATIVE

## 2021-05-24 LAB — CBG MONITORING, ED: Glucose-Capillary: 178 mg/dL — ABNORMAL HIGH (ref 70–99)

## 2021-05-24 MED ORDER — NITROFURANTOIN MONOHYD MACRO 100 MG PO CAPS: 100.0000 mg | ORAL_CAPSULE | Freq: Two times a day (BID) | ORAL | 0 refills | Status: AC

## 2021-05-24 NOTE — ED Provider Notes (Signed)
[facility]    CSN: [id number] Arrival date & time: 05/24/21  1131      History   Chief Complaint Chief Complaint  Patient presents with  . Urinary Frequency    HPI Chloe Peters is a 20 y.o. female.   Patient here for evaluation of urinary frequency, urgency and dysuria since Wednesday.  Reports taking AZO with minimal relief.  Denies any trauma, injury, or other precipitating event.  Denies any specific alleviating or aggravating factors.  Denies any fevers, chest pain, shortness of breath, N/V/D, numbness, tingling, weakness, abdominal pain, or headaches.    The history is provided by the patient.  Urinary Frequency Pertinent negatives include no abdominal pain and no headaches.    Past Medical History:  Diagnosis Date  . Type 1 diabetes (HCC)    Dx at age 24 years    Patient Active Problem List   Diagnosis Date Noted  . DM w/o complication type I, uncontrolled 05/24/2018    Past Surgical History:  Procedure Laterality Date  . NO PAST SURGERIES      OB History   No obstetric history on file.      Home Medications    Prior to Admission medications   Medication Sig Start Date End Date Taking? Authorizing Provider  nitrofurantoin, macrocrystal-monohydrate, (MACROBID) 100 MG capsule Take 1 capsule (100 mg total) by mouth 2 (two) times daily for 5 days. 05/24/21 05/29/21 Yes [name], NP  ACCU-CHEK FASTCLIX LANCETS MISC Check sugar 10 x daily 05/24/18   [name], MD  albuterol (VENTOLIN HFA) 108 (90 Base) MCG/ACT inhaler Inhale 108 each into the lungs as needed. 10/31/20   [provider]  azelastine (ASTELIN) 0.1 % nasal spray Place 2 sprays into both nostrils 2 (two) times daily as needed. Use in each nostril as directed 09/04/20   [name], MD  azithromycin (ZITHROMAX) 250 MG tablet Take 250 mg by mouth as directed. 10/31/20   [provider]  benzonatate (TESSALON) 200 MG capsule Take 200 mg by  mouth as needed. 11/01/20   [provider]  Clindamycin-Benzoyl Per, Refr, gel APPLY TO CLEAN AND DRY FACE EVERY MORNING 07/25/18   [provider]  desonide (DESOWEN) 0.05 % ointment Apply 1 application topically 2 (two) times daily as needed. 09/04/20   [name], MD  Glucagon (BAQSIMI TWO PACK) 3 MG/DOSE POWD Place 1 application into the nose as needed. Use as directed if unconscious, unable to take food po, or having a seizure due to hypoglycemia 10/04/18   [name], MD  glucagon 1 MG injection Inject 1 mg IM into thigh for severe Hypoglycemia and patient unconscious and cannot eat or drink 07/13/18   [name], MD  glucose blood (ACCU-CHEK GUIDE) test strip Use to check BG 6 times daily 11/14/18   [name], MD  hydrocortisone-pramoxine [facility]) 2.5-1 % rectal cream APPLY TO AFFECTED AREA 3 TIMES A DAY AS NEEDED 02/03/19   [provider]  hydrOXYzine (ATARAX/VISTARIL) 25 MG tablet TAKE 1 TABLET BY MOUTH EVERYDAY AT BEDTIME 07/19/20   [provider]  insulin aspart (NOVOLOG) 100 UNIT/ML FlexPen USE AS DIRECTED BY MD, UP TO 50 UNITS DAILY. 03/06/19   [name], MD  insulin degludec (TRESIBA FLEXTOUCH) 100 UNIT/ML SOPN FlexTouch Pen Inject up to 50 units at bedtime and per care plan 11/14/18   [name], MD  Insulin Pen Needle (BD PEN NEEDLE NANO U/F) 32G X  4 MM MISC use with insulin pen to inject up to 6 times daily 08/09/19   [name], MD  loratadine (CLARITIN) 10 MG tablet Take 1 tablet (10 mg total) by mouth daily. 03/10/21   [name], FNP  Olopatadine HCl 0.2 % SOLN 1 drop in each eye daily as needed 09/04/20   [name], MD  Polyethylene Glycol 3350 (PEG 3350) POWD Take by mouth. 08/11/16   [provider]    Family History Family History  Problem Relation Age of Onset  . Allergic rhinitis Mother   . Diabetes Paternal Aunt   .  Diabetes type I Maternal Grandmother   . Angioedema Neg Hx   . Asthma Neg Hx   . Eczema Neg Hx   . Atopy Neg Hx   . Immunodeficiency Neg Hx   . Urticaria Neg Hx     Social History Social History   Tobacco Use  . Smoking status: Never Smoker  . Smokeless tobacco: Never Used  Substance Use Topics  . Alcohol use: Never  . Drug use: Never     Allergies   Shellfish allergy   Review of Systems Review of Systems  Constitutional: Negative for fever.  Gastrointestinal: Negative for abdominal pain, nausea and vomiting.  Genitourinary: Positive for dysuria, frequency and urgency.  Musculoskeletal: Negative for back pain.  Neurological: Negative for headaches.  All other systems reviewed and are negative.    Physical Exam Triage Vital Signs ED Triage Vitals  Enc Vitals Group     BP 05/24/21 1215 121/74     Pulse Rate 05/24/21 1215 (!) 106     Resp 05/24/21 1215 16     Temp 05/24/21 1215 98.6 F (37 C)     Temp Source 05/24/21 1215 Oral     SpO2 05/24/21 1215 100 %     Weight --      Height --      Head Circumference --      Peak Flow --      Pain Score 05/24/21 1213 5     Pain Loc --      Pain Edu? --      Excl. in GC? --    No data found.  Updated Vital Signs BP 121/74 (BP Location: Left Arm)   Pulse (!) 106   Temp 98.6 F (37 C) (Oral)   Resp 16   SpO2 100%   Visual Acuity Right Eye Distance:   Left Eye Distance:   Bilateral Distance:    Right Eye Near:   Left Eye Near:    Bilateral Near:     Physical Exam Vitals and nursing note reviewed.  Constitutional:      General: She is not in acute distress.    Appearance: Normal appearance. She is not ill-appearing, toxic-appearing or diaphoretic.  HENT:     Head: Normocephalic and atraumatic.  Eyes:     Conjunctiva/sclera: Conjunctivae normal.  Cardiovascular:     Rate and Rhythm: Normal rate.     Pulses: Normal pulses.  Pulmonary:     Effort: Pulmonary effort is normal.  Abdominal:      General: Abdomen is flat.     Tenderness: There is no right CVA tenderness or left CVA tenderness.  Musculoskeletal:        General: Normal range of motion.     Cervical back: Normal range of motion.  Skin:    General: Skin is warm and dry.  Neurological:     General:  No focal deficit present.     Mental Status: She is alert and oriented to person, place, and time.  Psychiatric:        Mood and Affect: Mood normal.      UC Treatments / Results  Labs (all labs ordered are listed, but only abnormal results are displayed) Labs Reviewed  POCT URINALYSIS DIPSTICK, ED / UC - Abnormal; Notable for the following components:      Result Value   Glucose, UA >=1000 (*)    Ketones, ur 80 (*)    Nitrite POSITIVE (*)    All other components within normal limits  CBG MONITORING, ED - Abnormal; Notable for the following components:   Glucose-Capillary 178 (*)    All other components within normal limits  POC URINE PREG, ED    EKG   Radiology No results found.  Procedures Procedures (including critical care time)  Medications Ordered in UC Medications - No data to display  Initial Impression / Assessment and Plan / UC Course  I have reviewed the triage vital signs and the nursing notes.  Pertinent labs & imaging results that were available during my care of the patient were reviewed by me and considered in my medical decision making (see chart for details).    Urinalysis with glucose >1000 and Ketones 80.  CBG 178.  Patient does not have any symptoms so DKA is unlikely.  Urinalysis does show nitrites.  Will treat for UTI with macrobid twice a day for the next 5 days.  Encouraged fluids and rest. Discussed strict ED follow up for any worsening symptoms or signs of DKA.   Final Clinical Impressions(s) / UC Diagnoses   Final diagnoses:  Lower urinary tract infectious disease     Discharge Instructions     Take the macrobid twice a day for the next 5 days.    Make sure you  are drinking plenty of water.  You can continue to take AZO or AZO cranberry for symptom relief.   If you blood sugars continue to be very elevated or you develop any abdominal pain, back pain, shortness of breath, vomiting, or fevers go to the Emergency Department for further evaluation.   Return or go to the Emergency Department if symptoms worsen or do not improve in the next few days.      ED Prescriptions    Medication Sig Dispense Auth. Provider   nitrofurantoin, macrocrystal-monohydrate, (MACROBID) 100 MG capsule Take 1 capsule (100 mg total) by mouth 2 (two) times daily for 5 days. 10 capsule [name], NP     PDMP not reviewed this encounter.   [name], NP 05/24/21 1304

## 2021-05-24 NOTE — ED Triage Notes (Signed)
In with c/o urinary frequency, urgency and burning during urination since Wednesday  Pt has been taking AZO

## 2021-05-24 NOTE — Discharge Instructions (Addendum)
Take the macrobid twice a day for the next 5 days.    Make sure you are drinking plenty of water.  You can continue to take AZO or AZO cranberry for symptom relief.   If you blood sugars continue to be very elevated or you develop any abdominal pain, back pain, shortness of breath, vomiting, or fevers go to the Emergency Department for further evaluation.   Return or go to the Emergency Department if symptoms worsen or do not improve in the next few days.

## 2021-06-02 ENCOUNTER — Encounter (HOSPITAL_COMMUNITY): Payer: Self-pay

## 2021-06-02 ENCOUNTER — Ambulatory Visit (HOSPITAL_COMMUNITY)
Admission: EM | Admit: 2021-06-02 | Discharge: 2021-06-02 | Disposition: A | Discharge status: Home / Self Care | Payer: Medicaid Other | Attending: Emergency Medicine | Admitting: Emergency Medicine

## 2021-06-02 DIAGNOSIS — E109 Type 1 diabetes mellitus without complications: Secondary | ICD-10-CM | POA: Diagnosis not present

## 2021-06-02 DIAGNOSIS — Z794 Long term (current) use of insulin: Secondary | ICD-10-CM | POA: Diagnosis not present

## 2021-06-02 DIAGNOSIS — J069 Acute upper respiratory infection, unspecified: Secondary | ICD-10-CM | POA: Insufficient documentation

## 2021-06-02 DIAGNOSIS — Z20822 Contact with and (suspected) exposure to covid-19: Secondary | ICD-10-CM | POA: Diagnosis not present

## 2021-06-02 LAB — POC INFLUENZA A AND B ANTIGEN (URGENT CARE ONLY)
INFLUENZA A ANTIGEN, POC: NEGATIVE
INFLUENZA B ANTIGEN, POC: NEGATIVE

## 2021-06-02 MED ORDER — BENZONATATE 100 MG PO CAPS: 100.0000 mg | ORAL_CAPSULE | Freq: Three times a day (TID) | ORAL | 0 refills | Status: DC

## 2021-06-02 MED ORDER — GUAIFENESIN-DM 100-10 MG/5ML PO SYRP: 5.0000 mL | ORAL_SOLUTION | ORAL | 0 refills | Status: DC | PRN

## 2021-06-02 NOTE — ED Triage Notes (Signed)
Pt presents with a dry cough, congestion, and facial pressure X 2 days.  She states she has been feeling fatigued and states she has had chest pressure.  Pt states the chest pressure is gone now.

## 2021-06-02 NOTE — Discharge Instructions (Signed)
Covid test pending 24 hours, flu test pending  you will be called if positive  Can [place] tessalon pill three times a day  Can use 5 ML of syrup every 4 hours for cough and congestion  Can use 3 advil pills every 8 hours for body aches, chills, fever  Try to increase fluid intake and eat as tolerated, water, Gatorade Pedialyte, body amour, to prevent further dehydration

## 2021-06-02 NOTE — ED Provider Notes (Signed)
[facility]    CSN: [id number] Arrival date & time: 06/02/21  1013      History   Chief Complaint Chief Complaint  Patient presents with  . Cough  . Nasal Congestion  . Facial Pain    pressure  . Fatigue    HPI Chloe Peters is a 20 y.o. female.   Patient presents with non productive cough, nasal congestion, facial pressure when lying down, shortness of breath on exertion that resolves with rest, chest pressure that has resolved, dizziness  fatigue, body aches and chill beginning two days ago. Decreased appetite and poor fluid intake. Has not eat today.  Denies fever, ear pain or fullness, headache, sore throat, abdominal pain, N/V/D. Has been taking advil with no relief. No known sick contact   Past Medical History:  Diagnosis Date  . Type 1 diabetes (HCC)    Dx at age 52 years    Patient Active Problem List   Diagnosis Date Noted  . DM w/o complication type I, uncontrolled 05/24/2018    Past Surgical History:  Procedure Laterality Date  . NO PAST SURGERIES      OB History   No obstetric history on file.      Home Medications    Prior to Admission medications   Medication Sig Start Date End Date Taking? Authorizing Provider  benzonatate (TESSALON) 100 MG capsule Take 1 capsule (100 mg total) by mouth every 8 (eight) hours. 06/02/21  Yes Makena Murdock R, NP  guaiFENesin-dextromethorphan (ROBITUSSIN DM) 100-10 MG/5ML syrup Take 5 mLs by mouth every 4 (four) hours as needed for cough. 06/02/21  Yes Valinda Hoar, NP  ACCU-CHEK FASTCLIX LANCETS MISC Check sugar 10 x daily 05/24/18   [name], MD  albuterol (VENTOLIN HFA) 108 (90 Base) MCG/ACT inhaler Inhale 108 each into the lungs as needed. 10/31/20   [provider]  azelastine (ASTELIN) 0.1 % nasal spray Place 2 sprays into both nostrils 2 (two) times daily as needed. Use in each nostril as directed 09/04/20   [name], MD  azithromycin (ZITHROMAX) 250 MG tablet  Take 250 mg by mouth as directed. 10/31/20   [provider]  Clindamycin-Benzoyl Per, Refr, gel APPLY TO CLEAN AND DRY FACE EVERY MORNING 07/25/18   [provider]  desonide (DESOWEN) 0.05 % ointment Apply 1 application topically 2 (two) times daily as needed. 09/04/20   [name], MD  Glucagon (BAQSIMI TWO PACK) 3 MG/DOSE POWD Place 1 application into the nose as needed. Use as directed if unconscious, unable to take food po, or having a seizure due to hypoglycemia 10/04/18   [name], MD  glucagon 1 MG injection Inject 1 mg IM into thigh for severe Hypoglycemia and patient unconscious and cannot eat or drink 07/13/18   [name], MD  glucose blood (ACCU-CHEK GUIDE) test strip Use to check BG 6 times daily 11/14/18   [name], MD  hydrocortisone-pramoxine [facility]) 2.5-1 % rectal cream APPLY TO AFFECTED AREA 3 TIMES A DAY AS NEEDED 02/03/19   [provider]  hydrOXYzine (ATARAX/VISTARIL) 25 MG tablet TAKE 1 TABLET BY MOUTH EVERYDAY AT BEDTIME 07/19/20   [provider]  insulin aspart (NOVOLOG) 100 UNIT/ML FlexPen USE AS DIRECTED BY MD, UP TO 50 UNITS DAILY. 03/06/19   [name], MD  insulin degludec (TRESIBA FLEXTOUCH) 100 UNIT/ML SOPN FlexTouch Pen Inject up to 50 units at bedtime and per care plan 11/14/18   Larinda Buttery,  Audley Hose, MD  Insulin Pen Needle (BD PEN NEEDLE NANO U/F) 32G X 4 MM MISC use with insulin pen to inject up to 6 times daily 08/09/19   [name], MD  loratadine (CLARITIN) 10 MG tablet Take 1 tablet (10 mg total) by mouth daily. 03/10/21   [name], FNP  Olopatadine HCl 0.2 % SOLN 1 drop in each eye daily as needed 09/04/20   [name], MD  Polyethylene Glycol 3350 (PEG 3350) POWD Take by mouth. 08/11/16   [provider]    Family History Family History  Problem Relation Age of Onset  . Allergic rhinitis Mother   . Diabetes  Paternal Aunt   . Diabetes type I Maternal Grandmother   . Angioedema Neg Hx   . Asthma Neg Hx   . Eczema Neg Hx   . Atopy Neg Hx   . Immunodeficiency Neg Hx   . Urticaria Neg Hx     Social History Social History   Tobacco Use  . Smoking status: Never Smoker  . Smokeless tobacco: Never Used  Vaping Use  . Vaping Use: Never used  Substance Use Topics  . Alcohol use: Never  . Drug use: Never     Allergies   Shellfish allergy   Review of Systems Review of Systems  Defer to HPI    Physical Exam Triage Vital Signs ED Triage Vitals  Enc Vitals Group     BP 06/02/21 1116 132/72     Pulse Rate 06/02/21 1116 (!) 105     Resp 06/02/21 1116 17     Temp 06/02/21 1116 98.9 F (37.2 C)     Temp Source 06/02/21 1116 Oral     SpO2 06/02/21 1116 100 %     Weight --      Height --      Head Circumference --      Peak Flow --      Pain Score 06/02/21 1114 5     Pain Loc --      Pain Edu? --      Excl. in GC? --    No data found.  Updated Vital Signs BP 132/72 (BP Location: Left Arm)   Pulse (!) 105   Temp 98.9 F (37.2 C) (Oral)   Resp 17   LMP 05/12/2021 (Exact Date)   SpO2 100%   Visual Acuity Right Eye Distance:   Left Eye Distance:   Bilateral Distance:    Right Eye Near:   Left Eye Near:    Bilateral Near:     Physical Exam Constitutional:      Appearance: Normal appearance. She is normal weight.  HENT:     Head: Normocephalic.     Right Ear: Tympanic membrane, ear canal and external ear normal.     Left Ear: Tympanic membrane, ear canal and external ear normal.     Nose: Congestion and rhinorrhea present.     Mouth/Throat:     Mouth: Mucous membranes are moist.     Pharynx: Posterior oropharyngeal erythema present.  Eyes:     Extraocular Movements: Extraocular movements intact.  Cardiovascular:     Rate and Rhythm: Normal rate and regular rhythm.     Pulses: Normal pulses.     Heart sounds: Normal heart sounds.  Pulmonary:     Effort:  Pulmonary effort is normal.     Breath sounds: Normal breath sounds.  Musculoskeletal:        General: Normal range of  motion.     Cervical back: Normal range of motion.  Lymphadenopathy:     Cervical: Cervical adenopathy present.  Skin:    General: Skin is warm and dry.  Neurological:     Mental Status: She is alert and oriented to person, place, and time. Mental status is at baseline.  Psychiatric:        Mood and Affect: Mood normal.        Behavior: Behavior normal.      UC Treatments / Results  Labs (all labs ordered are listed, but only abnormal results are displayed) Labs Reviewed  SARS CORONAVIRUS 2 (TAT 6-24 HRS)  POC INFLUENZA A AND B ANTIGEN (URGENT CARE ONLY)    EKG   Radiology No results found.  Procedures Procedures (including critical care time)  Medications Ordered in UC Medications - No data to display  Initial Impression / Assessment and Plan / UC Course  I have reviewed the triage vital signs and the nursing notes.  Pertinent labs & imaging results that were available during my care of the patient were reviewed by me and considered in my medical decision making (see chart for details).  Viral URI with cough  1. covid pending 2. Flu- negative 3. Tessalon 100 mg tid 4. Robitussin DM 100-10 5 mL every 4 hours prn 5. Continue otc medications for pain 6. Increase fluid intake and eat as tolerated to further prevent dehydration  Final Clinical Impressions(s) / UC Diagnoses   Final diagnoses:  Viral URI with cough     Discharge Instructions     Covid test pending 24 hours, flu test pending  you will be called if positive  Can [place] tessalon pill three times a day  Can use 5 ML of syrup every 4 hours for cough and congestion  Can use 3 advil pills every 8 hours for body aches, chills, fever  Try to increase fluid intake and eat as tolerated, water, Gatorade Pedialyte, body amour, to prevent further dehydration     ED Prescriptions     Medication Sig Dispense Auth. Provider   benzonatate (TESSALON) 100 MG capsule Take 1 capsule (100 mg total) by mouth every 8 (eight) hours. 21 capsule Jashae Wiggs R, NP   guaiFENesin-dextromethorphan (ROBITUSSIN DM) 100-10 MG/5ML syrup Take 5 mLs by mouth every 4 (four) hours as needed for cough. 118 mL Cody Oliger, Elita Boone, NP     PDMP not reviewed this encounter.   Valinda Hoar, NP 06/02/21 1143

## 2021-06-03 LAB — SARS CORONAVIRUS 2 (TAT 6-24 HRS): SARS Coronavirus 2: NEGATIVE

## 2021-08-28 ENCOUNTER — Ambulatory Visit (HOSPITAL_COMMUNITY)
Admission: EM | Admit: 2021-08-28 | Discharge: 2021-08-28 | Disposition: A | Discharge status: Home / Self Care | Payer: Medicaid Other | Attending: Family Medicine | Admitting: Family Medicine

## 2021-08-28 ENCOUNTER — Encounter (HOSPITAL_COMMUNITY): Payer: Self-pay | Admitting: Emergency Medicine

## 2021-08-28 ENCOUNTER — Other Ambulatory Visit: Payer: Self-pay

## 2021-08-28 DIAGNOSIS — R059 Cough, unspecified: Secondary | ICD-10-CM | POA: Diagnosis present

## 2021-08-28 DIAGNOSIS — U071 COVID-19: Secondary | ICD-10-CM | POA: Diagnosis not present

## 2021-08-28 DIAGNOSIS — R52 Pain, unspecified: Secondary | ICD-10-CM | POA: Insufficient documentation

## 2021-08-28 DIAGNOSIS — J069 Acute upper respiratory infection, unspecified: Secondary | ICD-10-CM | POA: Insufficient documentation

## 2021-08-28 DIAGNOSIS — R509 Fever, unspecified: Secondary | ICD-10-CM | POA: Insufficient documentation

## 2021-08-28 LAB — POC INFLUENZA A AND B ANTIGEN (URGENT CARE ONLY)
INFLUENZA A ANTIGEN, POC: NEGATIVE
INFLUENZA B ANTIGEN, POC: NEGATIVE

## 2021-08-28 MED ORDER — ACETAMINOPHEN 325 MG PO TABS
650.0000 mg | ORAL_TABLET | Freq: Once | ORAL | Status: AC
  Administered 2021-08-28: 650 mg via ORAL

## 2021-08-28 MED ORDER — ACETAMINOPHEN 325 MG PO TABS
ORAL_TABLET | ORAL | Status: AC
  Filled 2021-08-28: qty 2

## 2021-08-28 MED ORDER — PROMETHAZINE-DM 6.25-15 MG/5ML PO SYRP: 5.0000 mL | ORAL_SOLUTION | Freq: Three times a day (TID) | ORAL | 0 refills | Status: DC | PRN

## 2021-08-28 NOTE — Discharge Instructions (Addendum)
Your flu test was negative.  We will contact you with your COVID test is done and we have these results.  Please monitor your MyChart.  I have called in Promethazine DM.  You can use this up to 3 times a day as needed for cough.  Will make you sleepy do not drive or drink alcohol while taking it.  Use over-the-counter medications including Tylenol, Mucinex, Flonase for additional symptom relief.  Make sure you are drinking plenty of fluid.  If you have any shortness of breath, high fever, chest pain, nausea/vomiting interfering with oral intake, ketones in your urine you need to be seen immediately.

## 2021-08-28 NOTE — ED Triage Notes (Signed)
Complains of headache, nasal congestion, body aches and dizziness.  Symptoms started on tuesday

## 2021-08-28 NOTE — ED Provider Notes (Signed)
[facility]    CSN: [id number] Arrival date & time: 08/28/21  1602      History   Chief Complaint Chief Complaint  Patient presents with   URI    HPI Chloe Peters is a 20 y.o. female.   Patient presents today with a 2-day history of URI symptoms.  She reports fever, nasal congestion, cough, body aches, dizziness, headache.  Denies chest pain, shortness of breath, vomiting, diarrhea, abdominal pain.  She does report mild nausea.  She has tried decongestants, antihistamines, over-the-counter medications without improvement of symptoms.  She denies history of asthma, COPD, allergies, smoking.  She does have a past medical history of type 1 diabetes and reports blood sugars have been elevated since onset of illness.  She has been monitoring her urine for ketones and reports this has been negative despite hyperglycemia.  She is up-to-date on COVID-19 vaccination as well as influenza vaccine.  Denies any recent antibiotic use.  She has not had COVID recently.  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   Past Medical History:  Diagnosis Date   Type 1 diabetes (HCC)    Dx at age 20 years    Patient Active Problem List   Diagnosis Date Noted   DM w/o complication type I, uncontrolled 05/24/2018    Past Surgical History:  Procedure Laterality Date   NO PAST SURGERIES      OB History   No obstetric history on file.      Home Medications    Prior to Admission medications   Medication Sig Start Date End Date Taking? Authorizing Provider  promethazine-dextromethorphan (PROMETHAZINE-DM) 6.25-15 MG/5ML syrup Take 5 mLs by mouth 3 (three) times daily as needed for cough. 08/28/21  Yes Ruie Sendejo, Noberto Retort, PA-C  ACCU-CHEK FASTCLIX LANCETS MISC Check sugar 10 x daily 05/24/18   [name], MD  albuterol (VENTOLIN HFA) 108 (90 Base) MCG/ACT inhaler Inhale 108 each into the lungs as needed. 10/31/20   [provider]  azelastine (ASTELIN) 0.1 % nasal spray Place 2 sprays into both nostrils 2 (two) times daily as needed. Use in each nostril as directed 09/04/20   [name], MD  azithromycin (ZITHROMAX) 250 MG tablet Take 250 mg by mouth as directed. 10/31/20   [provider]  benzonatate (TESSALON) 100 MG capsule Take 1 capsule (100 mg total) by mouth every 8 (eight) hours. 06/02/21   White, Elita Boone, NP  Clindamycin-Benzoyl Per, Refr, gel APPLY TO CLEAN AND DRY FACE EVERY MORNING 07/25/18   [provider]  desonide (DESOWEN) 0.05 % ointment Apply 1 application topically 2 (two) times daily as needed. 09/04/20   [name], MD  Glucagon (BAQSIMI TWO PACK) 3 MG/DOSE POWD Place 1 application into the nose as needed. Use as directed if unconscious, unable to take food po, or having a seizure due to hypoglycemia 10/04/18   [name], MD  glucagon 1 MG injection Inject 1 mg IM into thigh for severe Hypoglycemia and patient unconscious and cannot eat or drink 07/13/18   [name], MD  glucose blood (ACCU-CHEK GUIDE) test strip Use to check BG 6 times daily 11/14/18   [name], MD  hydrocortisone-pramoxine [facility]) 2.5-1 % rectal cream APPLY TO AFFECTED AREA 3 TIMES A DAY AS NEEDED 02/03/19   [provider]  hydrOXYzine (ATARAX/VISTARIL) 25 MG tablet TAKE 1 TABLET BY MOUTH EVERYDAY AT BEDTIME  07/19/20   [provider]  insulin aspart (NOVOLOG) 100 UNIT/ML FlexPen USE AS DIRECTED BY MD, UP TO 50 UNITS DAILY. 03/06/19   [name], MD  insulin degludec (TRESIBA FLEXTOUCH) 100 UNIT/ML SOPN FlexTouch Pen Inject up to 50 units at bedtime and per care plan 11/14/18   [name], MD  Insulin Pen Needle (BD PEN NEEDLE NANO U/F) 32G X 4 MM MISC use with insulin pen to inject up to 6 times daily 08/09/19   [name], MD  loratadine (CLARITIN) 10 MG tablet Take 1 tablet (10 mg  total) by mouth daily. 03/10/21   [name], FNP  Olopatadine HCl 0.2 % SOLN 1 drop in each eye daily as needed 09/04/20   [name], MD  Polyethylene Glycol 3350 (PEG 3350) POWD Take by mouth. 08/11/16   [provider]    Family History Family History  Problem Relation Age of Onset   Allergic rhinitis Mother    Diabetes Paternal Aunt    Diabetes type I Maternal Grandmother    Angioedema Neg Hx    Asthma Neg Hx    Eczema Neg Hx    Atopy Neg Hx    Immunodeficiency Neg Hx    Urticaria Neg Hx     Social History Social History   Tobacco Use   Smoking status: Never   Smokeless tobacco: Never  Vaping Use   Vaping Use: Never used  Substance Use Topics   Alcohol use: Never   Drug use: Never     Allergies   Shellfish allergy   Review of Systems Review of Systems  Constitutional:  Positive for activity change, appetite change, fatigue and fever.  HENT:  Positive for congestion. Negative for sinus pressure, sneezing and sore throat.   Respiratory:  Positive for cough. Negative for shortness of breath.   Cardiovascular:  Negative for chest pain.  Gastrointestinal:  Positive for nausea. Negative for abdominal pain and diarrhea.  Musculoskeletal:  Positive for arthralgias and myalgias.  Neurological:  Positive for dizziness and headaches. Negative for light-headedness.    Physical Exam Triage Vital Signs ED Triage Vitals  Enc Vitals Group     BP 08/28/21 1645 117/78     Pulse Rate 08/28/21 1645 (!) 115     Resp 08/28/21 1645 (!) 22     Temp 08/28/21 1645 (!) 100.5 F (38.1 C)     Temp Source 08/28/21 1645 Oral     SpO2 08/28/21 1645 100 %     Weight --      Height --      Head Circumference --      Peak Flow --      Pain Score 08/28/21 1641 7     Pain Loc --      Pain Edu? --      Excl. in GC? --    No data found.  Updated Vital Signs BP 120/78   Pulse (!) 108   Temp 100.1 F (37.8 C)   Resp (!) 22   SpO2 98%   Visual  Acuity Right Eye Distance:   Left Eye Distance:   Bilateral Distance:    Right Eye Near:   Left Eye Near:    Bilateral Near:     Physical Exam Vitals reviewed.  Constitutional:      General: She is awake. She is not in acute distress.    Appearance: Normal appearance. She is normal weight. She is not ill-appearing.  Comments: Very pleasant female appears stated age in no acute distress sitting comfortably in exam room  HENT:     Head: Normocephalic and atraumatic.     Right Ear: Tympanic membrane, ear canal and external ear normal. Tympanic membrane is not erythematous or bulging.     Left Ear: Tympanic membrane, ear canal and external ear normal. Tympanic membrane is not erythematous or bulging.     Nose:     Right Sinus: No maxillary sinus tenderness or frontal sinus tenderness.     Left Sinus: No maxillary sinus tenderness or frontal sinus tenderness.     Mouth/Throat:     Pharynx: Uvula midline. No oropharyngeal exudate or posterior oropharyngeal erythema.  Cardiovascular:     Rate and Rhythm: Normal rate and regular rhythm.     Heart sounds: Normal heart sounds, S1 normal and S2 normal. No murmur heard. Pulmonary:     Effort: Pulmonary effort is normal.     Breath sounds: Normal breath sounds. No wheezing, rhonchi or rales.     Comments: Clear to auscultation bilaterally Lymphadenopathy:     Head:     Right side of head: No submental, submandibular or tonsillar adenopathy.     Left side of head: No submental, submandibular or tonsillar adenopathy.     Cervical: No cervical adenopathy.  Psychiatric:        Behavior: Behavior is cooperative.     UC Treatments / Results  Labs (all labs ordered are listed, but only abnormal results are displayed) Labs Reviewed  SARS CORONAVIRUS 2 (TAT 6-24 HRS)  POC INFLUENZA A AND B ANTIGEN (URGENT CARE ONLY)    EKG   Radiology No results found.  Procedures Procedures (including critical care time)  Medications Ordered  in UC Medications  acetaminophen (TYLENOL) tablet 650 mg (650 mg Oral Given 08/28/21 1648)    Initial Impression / Assessment and Plan / UC Course  I have reviewed the triage vital signs and the nursing notes.  Pertinent labs & imaging results that were available during my care of the patient were reviewed by me and considered in my medical decision making (see chart for details).      Discussed likely viral etiology of symptoms.  Flu test was negative.  COVID test is pending.  Patient was instructed to use Promethazine DM up to 3 times a day as needed for cough.  Discussed that this make her sleepy so she should not drive or drink alcohol while taking it.  Recommend that she use Tylenol, Mucinex, Flonase for additional symptom relief.  Discussed that it is important to monitor the blood sugar and for ketones and if she develops hyperglycemia particularly with ketones in her urine she needs to be seen immediately.  Recommend she rest and drink plenty of fluid.  She was provided work/school note with current CDC return to work guidelines.  Discussed alarm symptoms that warrant emergent evaluation.  Strict return precautions given to which she expressed understanding.  Final Clinical Impressions(s) / UC Diagnoses   Final diagnoses:  Upper respiratory tract infection, unspecified type  Cough  Body aches  Fever, unspecified     Discharge Instructions      Your flu test was negative.  We will contact you with your COVID test is done and we have these results.  Please monitor your MyChart.  I have called in Promethazine DM.  You can use this up to 3 times a day as needed for cough.  Will make you sleepy do  not drive or drink alcohol while taking it.  Use over-the-counter medications including Tylenol, Mucinex, Flonase for additional symptom relief.  Make sure you are drinking plenty of fluid.  If you have any shortness of breath, high fever, chest pain, nausea/vomiting interfering with oral  intake, ketones in your urine you need to be seen immediately.     ED Prescriptions     Medication Sig Dispense Auth. Provider   promethazine-dextromethorphan (PROMETHAZINE-DM) 6.25-15 MG/5ML syrup Take 5 mLs by mouth 3 (three) times daily as needed for cough. 118 mL Pearley Millington K, PA-C      PDMP not reviewed this encounter.   Jeani Hawking, PA-C 08/28/21 1837

## 2021-08-29 LAB — SARS CORONAVIRUS 2 (TAT 6-24 HRS): SARS Coronavirus 2: POSITIVE — AB

## 2021-09-10 ENCOUNTER — Ambulatory Visit: Payer: Medicaid Other | Admitting: Allergy

## 2021-10-17 ENCOUNTER — Other Ambulatory Visit: Payer: Self-pay

## 2021-10-17 ENCOUNTER — Other Ambulatory Visit: Payer: Self-pay | Admitting: Family Medicine

## 2021-10-17 ENCOUNTER — Ambulatory Visit (INDEPENDENT_AMBULATORY_CARE_PROVIDER_SITE_OTHER): Payer: Medicaid Other | Admitting: Family Medicine

## 2021-10-17 ENCOUNTER — Encounter: Payer: Self-pay | Admitting: Family Medicine

## 2021-10-17 VITALS — BP 116/70 | HR 81 | Temp 98.2°F | Resp 20 | Ht 63.0 in | Wt 139.6 lb

## 2021-10-17 DIAGNOSIS — R432 Parageusia: Secondary | ICD-10-CM | POA: Insufficient documentation

## 2021-10-17 DIAGNOSIS — J301 Allergic rhinitis due to pollen: Secondary | ICD-10-CM | POA: Diagnosis not present

## 2021-10-17 DIAGNOSIS — T7800XD Anaphylactic reaction due to unspecified food, subsequent encounter: Secondary | ICD-10-CM | POA: Insufficient documentation

## 2021-10-17 DIAGNOSIS — H1013 Acute atopic conjunctivitis, bilateral: Secondary | ICD-10-CM

## 2021-10-17 DIAGNOSIS — L2089 Other atopic dermatitis: Secondary | ICD-10-CM

## 2021-10-17 DIAGNOSIS — R430 Anosmia: Secondary | ICD-10-CM

## 2021-10-17 MED ORDER — DESONIDE 0.05 % EX OINT
1.0000 | TOPICAL_OINTMENT | Freq: Two times a day (BID) | CUTANEOUS | 5 refills | Status: DC | PRN
Start: 2021-10-17 — End: 2023-01-11

## 2021-10-17 MED ORDER — EPINEPHRINE 0.3 MG/0.3ML IJ SOAJ: 0.3000 mg | INTRAMUSCULAR | 2 refills | Status: DC | PRN

## 2021-10-17 NOTE — Patient Instructions (Signed)
Allergic rhinitis Continue allergen avoidance measures directed toward grass pollen, weed pollen, and tree pollen as listed below Continue an antihistamine once a day as needed for runny nose or itch. Remember to rotate to a different antihistamine about every 3 months. Some examples of over the counter antihistamines include Zyrtec (cetirizine), Xyzal (levocetirizine), Allegra (fexofenadine), and Claritin (loratidine).  Continue azelastine 2 sprays in each nostril twice a day as needed for nasal symptoms Begin Flonase 2 sprays in each nostril once a day as needed for a stuffy nose. This may help to restore your ability to smell Consider saline nasal rinses as needed for nasal symptoms. Use this before any medicated nasal sprays for best result  Allergic conjunctivitis Continue olopatadine 1 drop in each eye once a day as needed for red or itchy eyes  Atopic dermatitis Continue with twice a day moisturizing routine Continue desonide 0.05% ointment to red stubborn itchy areas twice a day as needed.  Do not use this medication for longer than 2 weeks in a row  Food allergy Continue to avoid shellfish and mollusks.  In case of an allergic reaction, take Benadryl 50 mg every 4 hours, and if life-threatening symptoms occur, inject with EpiPen 0.3 mg.  Call the clinic if this treatment plan is not working well for you.  Follow up in 1 year or sooner if needed.  Reducing Pollen Exposure The American Academy [employer] suggests the following steps to reduce your exposure to pollen during allergy seasons. Do not hang sheets or clothing out to dry; pollen may collect on these items. Do not mow lawns or spend time around freshly cut grass; mowing stirs up pollen. Keep windows closed at night.  Keep car windows closed while driving. Minimize morning activities outdoors, a time when pollen counts are usually at their highest. Stay indoors as much as possible when pollen counts or  humidity is high and on windy days when pollen tends to remain in the air longer. Use air conditioning when possible.  Many air conditioners have filters that trap the pollen spores. Use a HEPA room air filter to remove pollen form the indoor air you breathe.

## 2021-10-17 NOTE — Progress Notes (Signed)
[address] [place] [address] Dept: [phone]  FOLLOW UP NOTE  Patient ID: Chloe Peters, female    DOB: [birth year]  Age: 20 y.o. MRN: [id number] Date of Office Visit: 10/17/2021  Assessment  Chief Complaint: Eczema (No flares )  HPI Chloe Peters is a 20 year old female who presents to the clinic for follow-up visit.  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  In the interim, she had COVID-19 about 1 month ago with symptoms including decreased smell, change in taste, cough, clear rhinorrhea, and fever for 3 days.  At today's visit, she reports allergic rhinitis has been moderately well controlled with symptoms including clear rhinorrhea for 1 month and frequent nasal congestion.  She continues Claritin 10 mg once a day and uses azelastine as needed.  She reports this regimen is not providing relief of symptoms.  She is not currently using Flonase or saline nasal rinses.  Her last environmental routine skin testing was 09/04/2020 and was positive to grass pollen, weed pollen, and tree pollen. Allergic conjunctivitis is reported as moderately well controlled with symptoms including red and itchy eyes with clear drainage for which she uses olopatadine daily with relief of symptoms.  Atopic dermatitis is reported as moderately well controlled with occasional red and itchy rash which mostly occurs on the back of her neck.  She continues Aveeno moisturizing lotion and uses desonide about once or twice a month with relief of symptoms.  She continues to avoid shellfish and mollusks with no accidental ingestion or EpiPen use since her last visit to this clinic.  Her current medications are listed in the chart.    Drug Allergies:  Allergies  Allergen Reactions   Shellfish Allergy Anaphylaxis    Physical Exam: BP 116/70   Pulse 81    Temp 98.2 F (36.8 C)   Resp 20   Ht 5\' 3"  (1.6 m)   Wt 139 lb 9.6 oz (63.3 kg)   SpO2 99%   BMI 24.73 kg/m    Physical Exam Vitals reviewed.  Constitutional:      Appearance: Normal appearance.  HENT:     Head: Normocephalic and atraumatic.     Right Ear: Tympanic membrane normal.     Left Ear: Tympanic membrane normal.     Nose:     Comments: Bilateral naris edematous and pale with right greater than left.  Pharynx normal.  Ears normal.  Eyes normal.    Mouth/Throat:     Pharynx: Oropharynx is clear.  Eyes:     Conjunctiva/sclera: Conjunctivae normal.  Cardiovascular:     Rate and Rhythm: Normal rate and regular rhythm.     Heart sounds: Normal heart sounds. No murmur heard. Pulmonary:     Effort: Pulmonary effort is normal.     Breath sounds: Normal breath sounds.     Comments: Lungs clear to auscultation Musculoskeletal:        General: Normal range of motion.     Cervical back: Normal range of motion and neck supple.  Skin:    General: Skin is warm and dry.  Neurological:     Mental Status: She is alert and oriented to person, place, and time.  Psychiatric:        Mood and Affect: Mood normal.        Behavior: Behavior normal.  Thought Content: Thought content normal.        Judgment: Judgment normal.    Assessment and Plan: 1. Seasonal allergic rhinitis due to pollen   2. Flexural atopic dermatitis   3. Allergic conjunctivitis of both eyes   4. Allergy with anaphylaxis due to food, subsequent encounter   5. Anosmia   6. Ageusia     Meds ordered this encounter  Medications   desonide (DESOWEN) 0.05 % ointment    Sig: Apply 1 application topically 2 (two) times daily as needed.    Dispense:  60 g    Refill:  5   EPINEPHrine (EPIPEN 2-PAK) 0.3 mg/0.3 mL IJ SOAJ injection    Sig: Inject 0.3 mg into the muscle as needed for anaphylaxis.    Dispense:  1 each    Refill:  2     Patient Instructions  Allergic rhinitis Continue allergen avoidance  measures directed toward grass pollen, weed pollen, and tree pollen as listed below Continue an antihistamine once a day as needed for runny nose or itch. Remember to rotate to a different antihistamine about every 3 months. Some examples of over the counter antihistamines include Zyrtec (cetirizine), Xyzal (levocetirizine), Allegra (fexofenadine), and Claritin (loratidine).  Continue azelastine 2 sprays in each nostril twice a day as needed for nasal symptoms Begin Flonase 2 sprays in each nostril once a day as needed for a stuffy nose. This may help to restore your ability to smell Consider saline nasal rinses as needed for nasal symptoms. Use this before any medicated nasal sprays for best result  Allergic conjunctivitis Continue olopatadine 1 drop in each eye once a day as needed for red or itchy eyes  Atopic dermatitis Continue with twice a day moisturizing routine Continue desonide 0.05% ointment to red stubborn itchy areas twice a day as needed.  Do not use this medication for longer than 2 weeks in a row  Food allergy Continue to avoid shellfish and mollusks.  In case of an allergic reaction, take Benadryl 50 mg every 4 hours, and if life-threatening symptoms occur, inject with EpiPen 0.3 mg.  Call the clinic if this treatment plan is not working well for you.  Follow up in 1 year or sooner if needed.   Return in about 1 year (around 10/17/2022), or if symptoms worsen or fail to improve.    Thank you for the opportunity to care for this patient.  Please do not hesitate to contact me with questions.  Thermon Leyland, FNP [facility]

## 2021-10-22 ENCOUNTER — Telehealth: Payer: Self-pay | Admitting: *Deleted

## 2021-10-22 ENCOUNTER — Other Ambulatory Visit: Payer: Self-pay | Admitting: *Deleted

## 2021-10-22 NOTE — Telephone Encounter (Signed)
PA has been submitted through CoverMyMeds for EpiPen and is currently pending approval/denial.

## 2021-10-22 NOTE — Telephone Encounter (Signed)
PA has been approved. PA has been faxed to patients pharmacy, labeled, and placed in bulk scanning.  

## 2022-01-22 ENCOUNTER — Other Ambulatory Visit (HOSPITAL_COMMUNITY)
Admission: RE | Admit: 2022-01-22 | Discharge: 2022-01-22 | Disposition: A | Discharge status: Home / Self Care | Payer: Medicaid Other | Source: Ambulatory Visit | Attending: Obstetrics & Gynecology | Admitting: Obstetrics & Gynecology

## 2022-01-22 ENCOUNTER — Encounter: Payer: Self-pay | Admitting: Obstetrics & Gynecology

## 2022-01-22 ENCOUNTER — Other Ambulatory Visit: Payer: Self-pay

## 2022-01-22 ENCOUNTER — Ambulatory Visit: Payer: Medicaid Other | Admitting: Obstetrics & Gynecology

## 2022-01-22 VITALS — BP 126/82 | HR 84 | Wt 142.8 lb

## 2022-01-22 DIAGNOSIS — R87612 Low grade squamous intraepithelial lesion on cytologic smear of cervix (LGSIL): Secondary | ICD-10-CM | POA: Diagnosis not present

## 2022-01-22 DIAGNOSIS — Z30016 Encounter for initial prescription of transdermal patch hormonal contraceptive device: Secondary | ICD-10-CM

## 2022-01-22 DIAGNOSIS — Z01419 Encounter for gynecological examination (general) (routine) without abnormal findings: Secondary | ICD-10-CM

## 2022-01-22 MED ORDER — NORELGESTROMIN-ETH ESTRADIOL 150-35 MCG/24HR TD PTWK: 1.0000 | MEDICATED_PATCH | TRANSDERMAL | 12 refills | Status: AC

## 2022-01-22 NOTE — Progress Notes (Signed)
NGYN pt presents annual, pap, all STD testing.  Pt requests to start the patch or Depo.  PHQ9= 3 GAD7= 9

## 2022-01-22 NOTE — Progress Notes (Signed)
Patient ID: Chloe Peters, female   DOB: 29-Jul-2001, 21 y.o.   MRN: [id number]  Chief Complaint  Patient presents with   New Patient (Initial Visit)    HPI Chloe Peters is a 21 y.o. female.  [id number] Patient's last menstrual period was 01/13/2022. Type 1 diabetes uses insulin pump. She is sexually active and requests contraceptive method and she has considered DMPA or patch. She has menstrual pain.Marland Kitchen HPI  Past Medical History:  Diagnosis Date   Eczema    Type 1 diabetes (HCC)    Dx at age 63 years    Past Surgical History:  Procedure Laterality Date   NO PAST SURGERIES      Family History  Problem Relation Age of Onset   Allergic rhinitis Mother    Diabetes Paternal Aunt    Diabetes type I Maternal Grandmother    Angioedema Neg Hx    Asthma Neg Hx    Eczema Neg Hx    Atopy Neg Hx    Immunodeficiency Neg Hx    Urticaria Neg Hx     Social History Social History   Tobacco Use   Smoking status: Never   Smokeless tobacco: Never  Vaping Use   Vaping Use: Never used  Substance Use Topics   Alcohol use: Never   Drug use: Never    Allergies  Allergen Reactions   Shellfish Allergy Anaphylaxis    Current Outpatient Medications  Medication Sig Dispense Refill   norelgestromin-ethinyl estradiol [facility]) 150-35 MCG/24HR transdermal patch Place 1 patch onto the skin once a week. 3 patch 12   ACCU-CHEK FASTCLIX LANCETS MISC Check sugar 10 x daily 306 each 3   albuterol (VENTOLIN HFA) 108 (90 Base) MCG/ACT inhaler Inhale 108 each into the lungs as needed.     azelastine (ASTELIN) 0.1 % nasal spray Place 2 sprays into both nostrils 2 (two) times daily as needed. Use in each nostril as directed 30 mL 5   azithromycin (ZITHROMAX) 250 MG tablet Take 250 mg by mouth as directed.     benzonatate (TESSALON) 100 MG capsule Take 1 capsule (100 mg total) by mouth every 8 (eight) hours. 21 capsule 0   Clindamycin-Benzoyl Per, Refr, gel APPLY TO CLEAN AND DRY FACE EVERY MORNING  3    desonide (DESOWEN) 0.05 % ointment Apply 1 application topically 2 (two) times daily as needed. 60 g 5   EPINEPHRINE 0.3 mg/0.3 mL IJ SOAJ injection INJECT 0.3 MG INTO THE MUSCLE AS NEEDED FOR ANAPHYLAXIS. 2 each 2   Glucagon (BAQSIMI TWO PACK) 3 MG/DOSE POWD Place 1 application into the nose as needed. Use as directed if unconscious, unable to take food po, or having a seizure due to hypoglycemia 2 each 1   glucagon 1 MG injection Inject 1 mg IM into thigh for severe Hypoglycemia and patient unconscious and cannot eat or drink 2 each 1   glucose blood (ACCU-CHEK GUIDE) test strip Use to check BG 6 times daily 200 each 8   hydrocortisone-pramoxine (ANALPRAM-HC) 2.5-1 % rectal cream APPLY TO AFFECTED AREA 3 TIMES A DAY AS NEEDED     hydrOXYzine (ATARAX/VISTARIL) 25 MG tablet TAKE 1 TABLET BY MOUTH EVERYDAY AT BEDTIME     insulin aspart (NOVOLOG) 100 UNIT/ML FlexPen USE AS DIRECTED BY MD, UP TO 50 UNITS DAILY. 15 mL 4   insulin degludec (TRESIBA FLEXTOUCH) 100 UNIT/ML SOPN FlexTouch Pen Inject up to 50 units at bedtime and per care plan 45 mL 1   Insulin Pen  Needle (BD PEN NEEDLE NANO U/F) 32G X 4 MM MISC use with insulin pen to inject up to 6 times daily 200 each 5   loratadine (CLARITIN) 10 MG tablet Take 1 tablet (10 mg total) by mouth daily. 30 tablet 5   Olopatadine HCl 0.2 % SOLN 1 drop in each eye daily as needed 2.5 mL 5   Polyethylene Glycol 3350 (PEG 3350) POWD Take by mouth.     promethazine-dextromethorphan (PROMETHAZINE-DM) 6.25-15 MG/5ML syrup Take 5 mLs by mouth 3 (three) times daily as needed for cough. 118 mL 0   No current facility-administered medications for this visit.    Review of Systems Review of Systems  Constitutional: Negative.   Respiratory: Negative.    Cardiovascular: Negative.   Gastrointestinal:  Positive for constipation.  Genitourinary:  Positive for menstrual problem (dysmenorrhea).   Blood pressure 126/82, pulse 84, weight 142 lb 12.8 oz (64.8 kg), last  menstrual period 01/13/2022.  Physical Exam Physical Exam Vitals and nursing note reviewed. Exam conducted with a chaperone present.  Constitutional:      Appearance: Normal appearance.  HENT:     Nose: Congestion present.  Cardiovascular:     Rate and Rhythm: Normal rate and regular rhythm.     Heart sounds: Normal heart sounds.  Pulmonary:     Effort: Pulmonary effort is normal.     Breath sounds: Normal breath sounds.  Chest:  Breasts:    Right: Normal.     Left: Normal.  Abdominal:     General: Abdomen is flat.     Palpations: Abdomen is soft.  Genitourinary:    General: Normal vulva.     Exam position: Lithotomy position.     Vagina: Normal.     Cervix: No discharge.     Rectum: Normal.  Skin:    General: Skin is warm and dry.  Neurological:     General: No focal deficit present.     Mental Status: She is alert.  Psychiatric:        Mood and Affect: Mood normal.        Behavior: Behavior normal.    Data Reviewed  Well woman exam with routine gynecological exam - Plan: Cervicovaginal ancillary only, Hepatitis B surface antigen, Hepatitis C antibody, RPR, HIV Antibody (routine testing w rflx), POCT urine pregnancy, Ambulatory referral to Integrated Behavioral Health  Encounter for initial prescription of transdermal patch hormonal contraceptive device - Plan: norelgestromin-ethinyl estradiol [facility] [facility]) 150-35 MCG/24HR transdermal patch Patient Active Problem List   Diagnosis Date Noted   Seasonal allergic rhinitis due to pollen 10/17/2021   Flexural atopic dermatitis 10/17/2021   Allergic conjunctivitis of both eyes 10/17/2021   Allergy with anaphylaxis due to food, 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   Ageusia 10/17/2021   DM w/o complication type I, uncontrolled 05/24/2018    Assessment Well woman exam with routine gynecological exam - Plan: Cervicovaginal ancillary only, Hepatitis B surface antigen, Hepatitis C antibody, RPR, HIV Antibody  (routine testing w rflx), POCT urine pregnancy, Ambulatory referral to Integrated Behavioral Health  Encounter for initial prescription of transdermal patch hormonal contraceptive device - Plan: norelgestromin-ethinyl estradiol [employer]) 150-35 MCG/24HR transdermal patch   Plan Meds ordered this encounter  Medications   norelgestromin-ethinyl estradiol [facility] [facility]) 150-35 MCG/24HR transdermal patch    Sig: Place 1 patch onto the skin once a week.    Dispense:  3 patch    Refill:  12       Scheryl Darter 01/22/2022, 3:01 PM

## 2022-01-23 LAB — CERVICOVAGINAL ANCILLARY ONLY
Chlamydia: NEGATIVE
Comment: NEGATIVE
Comment: NEGATIVE
Comment: NORMAL
Neisseria Gonorrhea: NEGATIVE
Trichomonas: NEGATIVE

## 2022-01-23 LAB — HEPATITIS C ANTIBODY: Hep C Virus Ab: <0.1 s/co ratio (ref 0.0–0.9)

## 2022-01-23 LAB — HEPATITIS B SURFACE ANTIGEN: Hepatitis B Surface Ag: NEGATIVE

## 2022-01-23 LAB — RPR: RPR Ser Ql: NONREACTIVE

## 2022-01-23 LAB — HIV ANTIBODY (ROUTINE TESTING W REFLEX): HIV Screen 4th Generation wRfx: NONREACTIVE

## 2022-01-28 ENCOUNTER — Encounter: Payer: Self-pay | Admitting: Obstetrics & Gynecology

## 2022-01-29 LAB — CYTOLOGY - PAP
Comment: NEGATIVE
High risk HPV: POSITIVE — AB

## 2022-01-30 ENCOUNTER — Ambulatory Visit (INDEPENDENT_AMBULATORY_CARE_PROVIDER_SITE_OTHER): Payer: Medicaid Other | Admitting: Licensed Clinical Social Worker

## 2022-01-30 ENCOUNTER — Other Ambulatory Visit (HOSPITAL_COMMUNITY)
Admission: RE | Admit: 2022-01-30 | Discharge: 2022-01-30 | Disposition: A | Discharge status: Home / Self Care | Payer: Medicaid Other | Source: Ambulatory Visit | Attending: Obstetrics and Gynecology | Admitting: Obstetrics and Gynecology

## 2022-01-30 ENCOUNTER — Other Ambulatory Visit: Payer: Self-pay

## 2022-01-30 ENCOUNTER — Ambulatory Visit (INDEPENDENT_AMBULATORY_CARE_PROVIDER_SITE_OTHER): Payer: Medicaid Other | Admitting: *Deleted

## 2022-01-30 DIAGNOSIS — N949 Unspecified condition associated with female genital organs and menstrual cycle: Secondary | ICD-10-CM

## 2022-01-30 DIAGNOSIS — R300 Dysuria: Secondary | ICD-10-CM | POA: Diagnosis not present

## 2022-01-30 DIAGNOSIS — F4321 Adjustment disorder with depressed mood: Secondary | ICD-10-CM | POA: Diagnosis not present

## 2022-01-30 DIAGNOSIS — N898 Other specified noninflammatory disorders of vagina: Secondary | ICD-10-CM

## 2022-01-30 DIAGNOSIS — R87612 Low grade squamous intraepithelial lesion on cytologic smear of cervix (LGSIL): Secondary | ICD-10-CM | POA: Insufficient documentation

## 2022-01-30 LAB — POCT URINALYSIS DIPSTICK
Bilirubin, UA: NEGATIVE
Blood, UA: NEGATIVE
Glucose, UA: POSITIVE — AB
Ketones, UA: NEGATIVE
Leukocytes, UA: NEGATIVE
Nitrite, UA: NEGATIVE
Protein, UA: NEGATIVE
Spec Grav, UA: 1.01 (ref 1.010–1.025)
Urobilinogen, UA: 0.2 E.U./dL
pH, UA: 7 (ref 5.0–8.0)

## 2022-01-30 NOTE — Progress Notes (Signed)
SUBJECTIVE: Chloe Peters is a 21 y.o. female who complains of dysuria x 3 weeks, without flank pain, fever, chills, or bleeding.  Pt also complains of vaginal irritation, burning and some pain with intercourse.   OBJECTIVE: Appears well, in no apparent distress.   Urine dipstick shows positive for glucose negative for all other components.    ASSESSMENT: Dysuria                            Vaginal irritation/burning  PLAN:  UC sent today. Self swab for BV/yeast. Treatment will be based on results.    Call or return to clinic prn if these symptoms worsen or fail to improve as anticipated.

## 2022-01-30 NOTE — Progress Notes (Signed)
Agree with nurses's documentation of this patient's clinic encounter.  Lynsie Mcwatters L, MD  

## 2022-02-01 ENCOUNTER — Ambulatory Visit (HOSPITAL_COMMUNITY)
Admission: EM | Admit: 2022-02-01 | Discharge: 2022-02-01 | Disposition: A | Discharge status: Home / Self Care | Payer: Medicaid Other | Attending: Emergency Medicine | Admitting: Emergency Medicine

## 2022-02-01 ENCOUNTER — Encounter (HOSPITAL_COMMUNITY): Payer: Self-pay | Admitting: Emergency Medicine

## 2022-02-01 ENCOUNTER — Other Ambulatory Visit: Payer: Self-pay

## 2022-02-01 DIAGNOSIS — R300 Dysuria: Secondary | ICD-10-CM

## 2022-02-01 DIAGNOSIS — M545 Low back pain, unspecified: Secondary | ICD-10-CM

## 2022-02-01 LAB — POC URINE PREG, ED: Preg Test, Ur: NEGATIVE

## 2022-02-01 LAB — POCT URINALYSIS DIPSTICK, ED / UC
Bilirubin Urine: NEGATIVE
Glucose, UA: >=1000 mg/dL — AB
Hgb urine dipstick: NEGATIVE
Ketones, ur: NEGATIVE mg/dL
Leukocytes,Ua: NEGATIVE
Nitrite: NEGATIVE
Protein, ur: NEGATIVE mg/dL
Specific Gravity, Urine: 1.02 (ref 1.005–1.030)
Urobilinogen, UA: 1 mg/dL (ref 0.0–1.0)
pH: 5.5 (ref 5.0–8.0)

## 2022-02-01 NOTE — ED Provider Notes (Signed)
[facility]    CSN: [id number] Arrival date & time: 02/01/22  1539    HISTORY   Chief Complaint  Patient presents with   Vaginal Itching   Dysuria   HPI Chloe Peters is a 21 y.o. female. Reports for a week having vaginal irritation and burning with urination. Reports saw her gyn twice recently and was tested for BV and Candida, is waiting for some results to come back, states her gynecology provider advised her to go to urgent care if her symptoms worsened.  Patient states she continues to have burning with urination however now has pain radiating to her back as well, states she has been taking ibuprofen with some relief.  Patient states the pain is intense.  Patient has normal blood pressure, normal heart rate and is afebrile on arrival today.  Patient appears to be in no acute distress.  The history is provided by the patient.  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 Authorizing Provider  ACCU-CHEK FASTCLIX LANCETS MISC Check sugar 10 x daily 05/24/18   [name], MD  desonide (DESOWEN) 0.05 % ointment Apply 1 application topically 2 (two)  times daily as needed. 10/17/21   Ambs, Norvel Richards, FNP  EPINEPHRINE 0.3 mg/0.3 mL IJ SOAJ injection INJECT 0.3 MG INTO THE MUSCLE AS NEEDED FOR ANAPHYLAXIS. 10/17/21   Hetty Blend, FNP  gabapentin (NEURONTIN) 100 MG capsule Take 100 mg by mouth 3 (three) times daily.    [provider]  Glucagon (BAQSIMI TWO PACK) 3 MG/DOSE POWD Place 1 application into the nose as needed. Use as directed if unconscious, unable to take food po, or having a seizure due to hypoglycemia 10/04/18   [name], MD  glucagon 1 MG injection Inject 1 mg IM into thigh for severe Hypoglycemia and patient unconscious and cannot eat or drink 07/13/18   [name], MD  glucose blood (ACCU-CHEK GUIDE) test strip Use to check BG 6 times daily 11/14/18   [name], MD  hydrOXYzine (ATARAX/VISTARIL) 25 MG tablet TAKE 1 TABLET BY MOUTH EVERYDAY AT BEDTIME 07/19/20   [provider]  insulin aspart (NOVOLOG) 100 UNIT/ML FlexPen USE AS DIRECTED BY MD, UP TO 50 UNITS DAILY. 03/06/19   [name], MD  insulin degludec (TRESIBA FLEXTOUCH) 100 UNIT/ML SOPN FlexTouch Pen Inject up to  50 units at bedtime and per care plan 11/14/18   Casimiro NeedleJessup, Ashley Bashioum, MD  Insulin Pen Needle (BD PEN NEEDLE NANO U/F) 32G X 4 MM MISC use with insulin pen to inject up to 6 times daily 08/09/19   Casimiro NeedleJessup, Ashley Bashioum, MD  loratadine (CLARITIN) 10 MG tablet Take 1 tablet (10 mg total) by mouth daily. 03/10/21   Nehemiah Settleale, Christine, FNP  norelgestromin-ethinyl estradiol [facility](XULANE) 150-35 MCG/24HR transdermal patch Place 1 patch onto the skin once a week. 01/22/22   Adam PhenixArnold, James G, MD  Olopatadine HCl 0.2 % SOLN 1 drop in each eye daily as needed 09/04/20   Marcelyn BruinsPadgett, Shaylar Patricia, MD   Family History Family History  Problem Relation Age of Onset   Allergic rhinitis Mother    Diabetes Paternal Aunt    Diabetes type I Maternal Grandmother    Angioedema Neg Hx    Asthma Neg Hx    Eczema Neg Hx     Atopy Neg Hx    Immunodeficiency Neg Hx    Urticaria Neg Hx    Social History Social History   Tobacco Use   Smoking status: Never   Smokeless tobacco: Never  Vaping Use   Vaping Use: Never used  Substance Use Topics   Alcohol use: Never   Drug use: Never   Allergies   Shellfish allergy  Review of Systems Review of Systems Pertinent findings noted in history of present illness.   Physical Exam Triage Vital Signs ED Triage Vitals  Enc Vitals Group     BP 10/24/21 0827 (!) 147/82     Pulse Rate 10/24/21 0827 72     Resp 10/24/21 0827 18     Temp 10/24/21 0827 98.3 F (36.8 C)     Temp Source 10/24/21 0827 Oral     SpO2 10/24/21 0827 98 %     Weight --      Height --      Head Circumference --      Peak Flow --      Pain Score 10/24/21 0826 5     Pain Loc --      Pain Edu? --      Excl. in GC? --   No data found.  Updated Vital Signs BP 132/62 (BP Location: Left Arm)    Pulse 84    Temp 98.9 F (37.2 C) (Oral)    Resp 16    LMP 01/13/2022    SpO2 97%   Physical Exam Vitals and nursing note reviewed.  Constitutional:      General: She is not in acute distress.    Appearance: Normal appearance. She is not ill-appearing.  HENT:     Head: Normocephalic and atraumatic.  Eyes:     General: Lids are normal.        Right eye: No discharge.        Left eye: No discharge.     Extraocular Movements: Extraocular movements intact.     Conjunctiva/sclera: Conjunctivae normal.     Right eye: Right conjunctiva is not injected.     Left eye: Left conjunctiva is not injected.  Neck:     Trachea: Trachea and phonation normal.  Cardiovascular:     Rate and Rhythm: Normal rate and regular rhythm.     Pulses: Normal pulses.     Heart sounds: Normal heart sounds. No murmur heard.   No friction rub. No gallop.  Pulmonary:     Effort: Pulmonary effort is normal. No accessory  muscle usage, prolonged expiration or respiratory distress.     Breath sounds: Normal breath  sounds. No stridor, decreased air movement or transmitted upper airway sounds. No decreased breath sounds, wheezing, rhonchi or rales.  Chest:     Chest wall: No tenderness.  Genitourinary:    Comments: Patient politely declines pelvic exam today, patient provided a vaginal swab for testing. Musculoskeletal:        General: Normal range of motion.     Cervical back: Normal range of motion and neck supple. Normal range of motion.  Lymphadenopathy:     Cervical: No cervical adenopathy.  Skin:    General: Skin is warm and dry.     Findings: No erythema or rash.  Neurological:     General: No focal deficit present.     Mental Status: She is alert and oriented to person, place, and time.  Psychiatric:        Mood and Affect: Mood normal.        Behavior: Behavior normal.    Visual Acuity Right Eye Distance:   Left Eye Distance:   Bilateral Distance:    Right Eye Near:   Left Eye Near:    Bilateral Near:     UC Couse / Diagnostics / Procedures:    EKG  Radiology No results found.  Procedures Procedures (including critical care time)  UC Diagnoses / Final Clinical Impressions(s)   I have reviewed the triage vital signs and the nursing notes.  Pertinent labs & imaging results that were available during my care of the patient were reviewed by me and considered in my medical decision making (see chart for details).    Final diagnoses:  Dysuria  Acute left-sided low back pain without sciatica   Urinalysis is remarkable for significant amount of glucose.  Patient advised.  Do not recommend urine culture, do not recommend antibiotics at this time.  Patient advised to await the results of BV swab and yeast swab performed by her provider, we will go ahead and perform some testing here as patient has already provided vaginal swab.  Patient advised she can continue to use ibuprofen for lower back pain. ED Prescriptions   None    PDMP not reviewed this encounter.  Pending  results:  Labs Reviewed  POCT URINALYSIS DIPSTICK, ED / UC - Abnormal; Notable for the following components:      Result Value   Glucose, UA >=1000 (*)    All other components within normal limits  POC URINE PREG, ED  CYTOLOGY, (ORAL, ANAL, URETHRAL) ANCILLARY ONLY    Medications Ordered in UC: Medications - No data to display  Disposition Upon Discharge:  Condition: stable for discharge home  Patient presented with concern for an acute illness with associated systemic symptoms and significant discomfort requiring urgent management. In my opinion, this is a condition that a prudent lay person (someone who possesses an average knowledge of health and medicine) may potentially expect to result in complications if not addressed urgently such as respiratory distress, impairment of bodily function or dysfunction of bodily organs.   As such, the patient has been evaluated and assessed, work-up was performed and treatment was provided in alignment with urgent care protocols and evidence based medicine.  Patient/parent/caregiver has been advised that the patient may require follow up for further testing and/or treatment if the symptoms continue in spite of treatment, as clinically indicated and appropriate.  Routine symptom specific, illness specific and/or disease specific instructions were discussed with the patient  and/or caregiver at length.  Prevention strategies for avoiding STD exposure were also discussed.  The patient will follow up with their current PCP if and as advised. If the patient does not currently have a PCP we will assist them in obtaining one.   The patient may need specialty follow up if the symptoms continue, in spite of conservative treatment and management, for further workup, evaluation, consultation and treatment as clinically indicated and appropriate.  Patient/parent/caregiver verbalized understanding and agreement of plan as discussed.  All questions were addressed  during visit.  Please see discharge instructions below for further details of plan.  Discharge Instructions:   Discharge Instructions      You have a significant amount of sugar in your urine which can certainly cause you to have significant burning with urination.  There are no other concerns for urinary tract infection apparent in your urine at this time. Antibiotics are not indicated due to there being no evidence of active infection during your visit today. I recommend that you await the results of your yeast and BV tests performed by your gynecologist.  We will also perform the same tests here and contact you with the results once testing is complete.  You are welcome to continue taking ibuprofen as needed for your lower back pain symptoms.    This office note has been dictated using Teaching laboratory technician.  Unfortunately, and despite my best efforts, this method of dictation can sometimes lead to occasional typographical or grammatical errors.  I apologize in advance if this occurs.      Theadora Rama Scales, PA-C 02/01/22 1755

## 2022-02-01 NOTE — ED Triage Notes (Signed)
Reports for a week having vaginal irritation and burning with urination. Reports saw her gyn twice recently and was tested for STDs, waiting for some results to come back.

## 2022-02-01 NOTE — BH Specialist Note (Signed)
Integrated Behavioral Health Initial In-Person Visit  MRN: [id number] Name: Naveah Brave. Urbanski  Number of Integrated Behavioral Health Clinician visits:: 1/6 Session Start time: 9:30am  Session End time: 10:00am Total time: 30 minutes in person at [facility]  Types of Service: Individual psychotherapy  Interpretor:No. Interpretor Name and Language: None   Warm Hand Off Completed.        Subjective: Joyice C. Basic is a 21 y.o. female accompanied by n/a Patient was referred by Dr Debroah Loop  for [facility]. Patient reports the following symptoms/concerns: depressed mood, family conflict, feeling overwhelmed, social isolation, social stressors,  Duration of problem: approx 3 weeks ; Severity of problem: mild  Objective: Mood: Depressed and Affect: Appropriate Risk of harm to self or others: No plan to harm self or others  Life Context: Family and Social: Family live [place]  School/Work: [employer] Self-Care: n/a Life Changes: n/a  Patient and/or Family's Strengths/Protective Factors: Concrete supports in place (healthy food, safe environments, etc.)  Goals Addressed: Patient will: Reduce symptoms of: depression Increase knowledge and/or ability of: coping skills and stress reduction  Demonstrate ability to: Increase adequate support systems for patient/family  Progress towards Goals: Ongoing  Interventions: Interventions utilized: Supportive Counseling  Standardized Assessments completed: PHQ 9  Patient and/or Family Response: Ms. Pederson responded well to appt.   Assessment: Patient currently experiencing adjustment disorder with depressed mood .   Patient may benefit from integrated behavioral health.  Plan: Follow up with behavioral health clinician on : 02/20/2022 Behavioral recommendations: engage in self care and stress reducing activities, communicate needs for added support, collaborate with on campus counseling services at [facility], prioritize rest Referral(s): Integrated  [facility] [facility] (In Clinic) "From scale of 1-10, how likely are you to follow plan?":    Gwyndolyn Saxon, LCSW

## 2022-02-01 NOTE — Discharge Instructions (Addendum)
You have a significant amount of sugar in your urine which can certainly cause you to have significant burning with urination.  There are no other concerns for urinary tract infection apparent in your urine at this time. Antibiotics are not indicated due to there being no evidence of active infection during your visit today. I recommend that you await the results of your yeast and BV tests performed by your gynecologist.  We will also perform the same tests here and contact you with the results once testing is complete.  You are welcome to continue taking ibuprofen as needed for your lower back pain symptoms.

## 2022-02-02 ENCOUNTER — Encounter: Payer: Self-pay | Admitting: Obstetrics & Gynecology

## 2022-02-02 LAB — URINE CULTURE

## 2022-02-02 LAB — CERVICOVAGINAL ANCILLARY ONLY
Bacterial Vaginitis (gardnerella): NEGATIVE
Candida Glabrata: NEGATIVE
Candida Vaginitis: NEGATIVE
Comment: NEGATIVE
Comment: NEGATIVE
Comment: NEGATIVE

## 2022-02-03 ENCOUNTER — Encounter: Payer: Self-pay | Admitting: *Deleted

## 2022-02-03 ENCOUNTER — Other Ambulatory Visit: Payer: Self-pay | Admitting: *Deleted

## 2022-02-03 DIAGNOSIS — N390 Urinary tract infection, site not specified: Secondary | ICD-10-CM

## 2022-02-03 DIAGNOSIS — R300 Dysuria: Secondary | ICD-10-CM

## 2022-02-03 MED ORDER — PHENAZOPYRIDINE HCL 200 MG PO TABS: 200.0000 mg | ORAL_TABLET | Freq: Three times a day (TID) | ORAL | 1 refills | Status: DC | PRN

## 2022-02-03 MED ORDER — AMOXICILLIN-POT CLAVULANATE 875-125 MG PO TABS: 1.0000 | ORAL_TABLET | Freq: Two times a day (BID) | ORAL | 0 refills | Status: DC

## 2022-02-03 NOTE — Progress Notes (Signed)
MyChart message sent regarding abnormal Pap and need for colpo. Education on colpo included. Patient previously communicating via MyChart today.

## 2022-02-09 ENCOUNTER — Encounter: Payer: Self-pay | Admitting: Obstetrics & Gynecology

## 2022-02-20 ENCOUNTER — Ambulatory Visit (INDEPENDENT_AMBULATORY_CARE_PROVIDER_SITE_OTHER): Payer: Medicaid Other | Admitting: Licensed Clinical Social Worker

## 2022-02-20 ENCOUNTER — Encounter: Payer: Self-pay | Admitting: Obstetrics & Gynecology

## 2022-02-20 DIAGNOSIS — F4321 Adjustment disorder with depressed mood: Secondary | ICD-10-CM | POA: Diagnosis not present

## 2022-02-20 NOTE — BH Specialist Note (Signed)
Integrated Behavioral Health via Telemedicine Visit  02/20/2022 Chloe Peters [id number]  Number of Integrated Behavioral Health Clinician visits: 1 Session Start time: 900am  Session End time: 933am Total time in minutes: 33 mins via mychart video   Referring Provider: Dr. Debroah Loop   Patient/Family location: Home  [facility] Provider location: [facility]  All persons participating in visit: Pt A Florance and LCSW A.Jonie Burdell  Types of Service: Individual psychotherapy and Video visit  I connected with Linzi C. Kindt and/or Glendora C. Tesch's n/a via  Telephone or Engineer, civil (consulting)  (Video is Caregility application) and verified that I am speaking with the correct person using two identifiers. Discussed confidentiality: Yes   I discussed the limitations of telemedicine and the availability of in person appointments.  Discussed there is a possibility of technology failure and discussed alternative modes of communication if that failure occurs.  I discussed that engaging in this telemedicine visit, they consent to the provision of behavioral healthcare and the services will be billed under their insurance.  Patient and/or legal guardian expressed understanding and consented to Telemedicine visit: Yes   Presenting Concerns: Patient and/or family reports the following symptoms/concerns: psychosocial stress and depression  Duration of problem: approx 2 months ; Severity of problem: mild  Patient and/or Family's Strengths/Protective Factors: Concrete supports in place (healthy food, safe environments, etc.)  Goals Addressed: Patient will:  Reduce symptoms of: depression and stress   Increase knowledge and/or ability of: coping skills and stress reduction   Demonstrate ability to: Increase adequate support systems for patient/family  Progress towards Goals: Ongoing  Interventions: Interventions utilized:  Supportive Counseling Standardized Assessments completed: PHQ  9  Patient and/or Family Response: Ms. Chloe Peters responded well to mychart video   Assessment: Patient currently experiencing adjustment disorder with depressed mood and pyschosocial stress .   Patient may benefit from integrated behavioral health .  Plan: Follow up with behavioral health clinician on : 3 weeks via mychart 03/06/2022 Behavioral recommendations: Contact apartment resources given, prioritize rest, physical activity and create boundaries to reduce stress  Referral(s): Integrated [employer] (In Clinic)  I discussed the assessment and treatment plan with the patient and/or parent/guardian. They were provided an opportunity to ask questions and all were answered. They agreed with the plan and demonstrated an understanding of the instructions.   They were advised to call back or seek an in-person evaluation if the symptoms worsen or if the condition fails to improve as anticipated.  Gwyndolyn Saxon, LCSW

## 2022-02-23 ENCOUNTER — Telehealth: Payer: Self-pay | Admitting: *Deleted

## 2022-02-23 NOTE — Telephone Encounter (Signed)
PA has been submitted through CoverMyMeds for [employer] and is currently pending approval/denial.  

## 2022-02-23 NOTE — Telephone Encounter (Signed)
PA has been approved for [employer]. PA has been faxed to patients pharmacy, labeled, and placed in bulk scanning.

## 2022-02-24 ENCOUNTER — Ambulatory Visit (INDEPENDENT_AMBULATORY_CARE_PROVIDER_SITE_OTHER): Payer: Medicaid Other | Admitting: Obstetrics & Gynecology

## 2022-02-24 ENCOUNTER — Other Ambulatory Visit: Payer: Self-pay

## 2022-02-24 VITALS — BP 115/71 | HR 76 | Wt 146.0 lb

## 2022-02-24 DIAGNOSIS — R87612 Low grade squamous intraepithelial lesion on cytologic smear of cervix (LGSIL): Secondary | ICD-10-CM

## 2022-02-24 NOTE — Progress Notes (Signed)
Pt is in office for Colpo today Last pap Jan 2023- LSIL, +HRHPV

## 2022-02-24 NOTE — Progress Notes (Signed)
Patient ID: Chloe Peters, female   DOB: 01/10/2001, 21 y.o.   MRN: [id number]  Chief Complaint  Patient presents with   Colposcopy    HPI Chloe Peters is a 21 y.o. female.  [id number] Patient's last menstrual period was 02/10/2022.  HPI  Indications: Pap smear on January 2023 showed: low-grade squamous intraepithelial neoplasia (LGSIL - encompassing HPV,mild dysplasia,CIN I). Previous colposcopy: no. Prior cervical treatment: no treatment.  Past Medical History:  Diagnosis Date   Eczema    Type 1 diabetes (HCC)    Dx at age 56 years    Past Surgical History:  Procedure Laterality Date   NO PAST SURGERIES      Family History  Problem Relation Age of Onset   Allergic rhinitis Mother    Diabetes Paternal Aunt    Diabetes type I Maternal Grandmother    Angioedema Neg Hx    Asthma Neg Hx    Eczema Neg Hx    Atopy Neg Hx    Immunodeficiency Neg Hx    Urticaria Neg Hx     Social History Social History   Tobacco Use   Smoking status: Never   Smokeless tobacco: Never  Vaping Use   Vaping Use: Never used  Substance Use Topics   Alcohol use: Never   Drug use: Never    Allergies  Allergen Reactions   Shellfish Allergy Anaphylaxis    Current Outpatient Medications  Medication Sig Dispense Refill   amoxicillin-clavulanate (AUGMENTIN) 875-125 MG tablet Take 1 tablet by mouth 2 (two) times daily. 7 tablet 0   gabapentin (NEURONTIN) 100 MG capsule Take 100 mg by mouth 3 (three) times daily.     insulin aspart (NOVOLOG) 100 UNIT/ML FlexPen USE AS DIRECTED BY MD, UP TO 50 UNITS DAILY. 15 mL 4   linaclotide (LINZESS) 72 MCG capsule Take 72 mcg by mouth daily before breakfast.     norelgestromin-ethinyl estradiol Marilu Favre) 150-35 MCG/24HR transdermal patch Place 1 patch onto the skin once a week. 3 patch 12   phenazopyridine (PYRIDIUM) 200 MG tablet Take 1 tablet (200 mg total) by mouth 3 (three) times daily as needed for pain (urethral spasm). 10 tablet 1   ACCU-CHEK  FASTCLIX LANCETS MISC Check sugar 10 x daily 306 each 3   desonide (DESOWEN) 0.05 % ointment Apply 1 application topically 2 (two) times daily as needed. 60 g 5   EPINEPHRINE 0.3 mg/0.3 mL IJ SOAJ injection INJECT 0.3 MG INTO THE MUSCLE AS NEEDED FOR ANAPHYLAXIS. 2 each 2   Glucagon (BAQSIMI TWO PACK) 3 MG/DOSE POWD Place 1 application into the nose as needed. Use as directed if unconscious, unable to take food po, or having a seizure due to hypoglycemia 2 each 1   glucagon 1 MG injection Inject 1 mg IM into thigh for severe Hypoglycemia and patient unconscious and cannot eat or drink 2 each 1   glucose blood (ACCU-CHEK GUIDE) test strip Use to check BG 6 times daily 200 each 8   hydrOXYzine (ATARAX/VISTARIL) 25 MG tablet TAKE 1 TABLET BY MOUTH EVERYDAY AT BEDTIME     insulin degludec (TRESIBA FLEXTOUCH) 100 UNIT/ML SOPN FlexTouch Pen Inject up to 50 units at bedtime and per care plan 45 mL 1   Insulin Pen Needle (BD PEN NEEDLE NANO U/F) 32G X 4 MM MISC use with insulin pen to inject up to 6 times daily 200 each 5   loratadine (CLARITIN) 10 MG tablet Take 1 tablet (10 mg total) by mouth daily. [place]  tablet 5   Olopatadine HCl 0.2 % SOLN 1 drop in each eye daily as needed 2.5 mL 5   No current facility-administered medications for this visit.    Review of Systems Review of Systems  Blood pressure 115/71, pulse 76, weight 146 lb (66.2 kg), last menstrual period 02/10/2022.  Physical Exam Physical Exam  Data Reviewed Pap 12/2021 Patient given informed consent, signed copy in the chart, time out was performed.  Placed in lithotomy position. Cervix viewed with speculum and colposcope after application of acetic acid.   Colposcopy adequate?  yes Acetowhite lesions?no Punctation?no Mosaicism?  no Abnormal vasculature?  no Biopsies?no ECC?no  COMMENTS: Patient was given post procedure instructions.   Assessment    Procedure Details  The risks and benefits of the procedure and Written  informed consent obtained.  Speculum placed in vagina and excellent visualization of cervix achieved, cervix swabbed x 3 with acetic acid solution.  Specimens: none  Complications: none.     Plan    Repeat pap in 12 months in accordance with [facility] protocol for <24 y/o LSIL      Chloe Peters 02/24/2022, 3:55 PM

## 2022-03-06 ENCOUNTER — Ambulatory Visit (INDEPENDENT_AMBULATORY_CARE_PROVIDER_SITE_OTHER): Payer: Medicaid Other | Admitting: Licensed Clinical Social Worker

## 2022-03-06 DIAGNOSIS — F4321 Adjustment disorder with depressed mood: Secondary | ICD-10-CM

## 2022-03-09 NOTE — BH Specialist Note (Signed)
Integrated Behavioral Health via Telemedicine Visit ? ?03/09/2022 ?Chloe Peters ?[id number] ? ?Number of Integrated Behavioral Health Clinician visits: 3 ?Session Start time:  9:04am ?Session End time: 9:20am  ?Total time in minutes: 16 mins via mychart video  ? ?Referring Provider: Dr. Debroah Loop  ?Patient/Family location: Home  ?[facility] Provider location: [facility] ?All persons participating in visit: Chloe Peters and LCSW A.Felton Clinton  ?Types of Service: Individual psychotherapy and Video visit ? ?I connected with Chloe Peters and/or Chloe Peters's n/a via  Telephone or Engineer, civil (consulting)  (Video is Caregility application) and verified that I am speaking with the correct person using two identifiers. Discussed confidentiality: Yes  ? ?I discussed the limitations of telemedicine and the availability of in person appointments.  Discussed there is a possibility of technology failure and discussed alternative modes of communication if that failure occurs. ? ?I discussed that engaging in this telemedicine visit, they consent to the provision of behavioral healthcare and the services will be billed under their insurance. ? ?Patient and/or legal guardian expressed understanding and consented to Telemedicine visit: Yes  ? ?Presenting Concerns: ?Patient and/or family reports the following symptoms/concerns: depressed mood, relocation, limited social and family support ?Duration of problem: approx 3 months; Severity of problem: mild ? ?Patient and/or Family's Strengths/Protective Factors: ?Concrete supports in place (healthy food, safe environments, etc.) ? ?Goals Addressed: ?Patient will: ? Reduce symptoms of: depression  ? Increase knowledge and/or ability of: coping skills  ? Demonstrate ability to: Increase adequate support systems for patient/family ? ?Progress towards Goals: ?Achieved ? ?Interventions: ?Interventions utilized:  Solution-Focused Strategies and Supportive Counseling ?Standardized  Assessments completed: Not Needed ? ?Patient and/or Family Response: Chloe Peters responded well to mychart visit  ? ?Assessment: ?Patient currently experiencing adjustment disorder with depressed mood.  ? ?Patient may benefit from campus mental health resources. ? ?Plan: ?Follow up with behavioral health clinician on : as needed  ?Behavioral recommendations: Continue therapy with campus mental health resources, prioritize rest and self care ?Referral(s): Paramedic (LME/Outside Clinic) ? ?I discussed the assessment and treatment plan with the patient and/or parent/guardian. They were provided an opportunity to ask questions and all were answered. They agreed with the plan and demonstrated an understanding of the instructions. ?  ?They were advised to call back or seek an in-person evaluation if the symptoms worsen or if the condition fails to improve as anticipated. ? ?Gwyndolyn Saxon, LCSW ?
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
## 2022-03-17 ENCOUNTER — Ambulatory Visit: Payer: Medicaid Other | Attending: Urology

## 2022-03-17 ENCOUNTER — Other Ambulatory Visit: Payer: Self-pay

## 2022-03-17 DIAGNOSIS — M6281 Muscle weakness (generalized): Secondary | ICD-10-CM | POA: Insufficient documentation

## 2022-03-17 DIAGNOSIS — R279 Unspecified lack of coordination: Secondary | ICD-10-CM | POA: Diagnosis present

## 2022-03-17 DIAGNOSIS — M62838 Other muscle spasm: Secondary | ICD-10-CM | POA: Insufficient documentation

## 2022-03-17 NOTE — Patient Instructions (Signed)
Squatty potty: When your knees are level or below the level of your hips, pelvic floor muscles are pressed against rectum, preventing ease of bowel movement. By getting knees above the level of the hips, these pelvic floor muscles relax, allowing easier passage of bowel movement. ? Ways to get knees above hips: o Squatty Potty (7inch and 9inch versions) o Small stool o Roll of toilet paper under each foot o Hardback book or stack of magazines under each foot  Relaxed Toileting mechanics: Once in this position, make sure to lean forward with forearms on thighs, wide knees, relaxed stomach, and breathe.    Bowel massage: To assist with more regular and more comfortable bowel movements, try performing bowel massage nightly for 5-10 minutes. Place hands in the lower right side of your abdomen to start; in small circles, massage up, across, and down the left side of your abdomen. Pressure does not need to be hard, but just comfortable. You can use lotion or oil to make more comfortable.  

## 2022-03-23 ENCOUNTER — Ambulatory Visit: Payer: Medicaid Other

## 2022-03-23 ENCOUNTER — Other Ambulatory Visit: Payer: Self-pay

## 2022-03-23 DIAGNOSIS — M62838 Other muscle spasm: Secondary | ICD-10-CM

## 2022-03-23 DIAGNOSIS — R279 Unspecified lack of coordination: Secondary | ICD-10-CM

## 2022-03-23 DIAGNOSIS — M6281 Muscle weakness (generalized): Secondary | ICD-10-CM

## 2022-03-23 NOTE — Patient Instructions (Signed)
Vulvar/vaginal Massage: This is a technique to help decrease painful sensitivity in the vaginal area. It can also help to restore normal moisture levels in the vaginal tissues. With coconut oil, aloe, jojoba oil, or a specific vaginal moisturizer, gently massage into vaginal tissues. Think of this as part of your post-shower routine and moisturizing just like you would the rest of the body with lotion. This helps to increase good blood flow to the vaginal tissues. In addition, it also teaches the body that touch to the vagina does not have to be painful or threatening, but moisturizing and gentle.     
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
## 2022-03-26 ENCOUNTER — Encounter: Payer: Self-pay | Admitting: Obstetrics & Gynecology

## 2022-03-27 ENCOUNTER — Encounter: Payer: Medicaid Other | Admitting: Licensed Clinical Social Worker

## 2022-03-31 ENCOUNTER — Ambulatory Visit (INDEPENDENT_AMBULATORY_CARE_PROVIDER_SITE_OTHER): Payer: Medicaid Other | Admitting: Licensed Clinical Social Worker

## 2022-03-31 DIAGNOSIS — F4321 Adjustment disorder with depressed mood: Secondary | ICD-10-CM | POA: Diagnosis not present

## 2022-03-31 NOTE — BH Specialist Note (Signed)
Integrated Behavioral Health via Telemedicine Visit ? ?03/31/2022 ?Chloe Peters ?[id number] ? ?Number of Integrated Behavioral Health Clinician visits: 4- Fourth Visit ? ?Session Start time: 1002 ?  ?Session End time: 1030 ? ?Total time in minutes: 28 ?Via [employer] video  ? ?Referring Provider: Dr. Debroah Loop  ?Patient/Family location: Home  ?[facility] Provider location: [facility]  ?All persons participating in visit: Chloe Peters and LCSW A. Felton Clinton ?Types of Service: Individual psychotherapy and Video visit ? ?I connected with Chloe Peters and/or Chloe Peters's n/a via  Telephone or Engineer, civil (consulting)  (Video is Caregility application) and verified that I am speaking with the correct person using two identifiers. Discussed confidentiality: No  ? ?I discussed the limitations of telemedicine and the availability of in person appointments.  Discussed there is a possibility of technology failure and discussed alternative modes of communication if that failure occurs. ? ?I discussed that engaging in this telemedicine visit, they consent to the provision of behavioral healthcare and the services will be billed under their insurance. ? ?Patient and/or legal guardian expressed understanding and consented to Telemedicine visit: Yes  ? ?Presenting Concerns: ?Patient and/or family reports the following symptoms/concerns: depressed mood  ?Duration of problem: approx 3 months; Severity of problem: mild ? ?Patient and/or Family's Strengths/Protective Factors: ?Concrete supports in place (healthy food, safe environments, etc.) ? ?Goals Addressed: ?Patient will: ? Reduce symptoms of: depression and stress  ? Increase knowledge and/or ability of: coping skills and stress reduction  ? Demonstrate ability to: Increase healthy adjustment to current life circumstances and Increase adequate support systems for patient/family ? ?Progress towards Goals: ?Ongoing ? ?Interventions: ?Interventions utilized:  Supportive  Counseling ?Standardized Assessments  ?completed: PHQ 9 ? ?Patient and/or Family Response: [name] responded well to visit  ? ?Assessment: ?Patient currently experiencing adjustment disorder with depressed mood .  ? ?Patient may benefit from integrated behavioral health. ? ?Plan: ?Follow up with behavioral health clinician on : prn  ?Behavioral recommendations: prioritize task, engage in self care and collaborate with [facility] social resources for added support ?Referral(s): Integrated [employer] (In Clinic) ? ?I discussed the assessment and treatment plan with the patient and/or parent/guardian. They were provided an opportunity to ask questions and all were answered. They agreed with the plan and demonstrated an understanding of the instructions. ?  ?They were advised to call back or seek an in-person evaluation if the symptoms worsen or if the condition fails to improve as anticipated. ? ?Gwyndolyn Saxon, LCSW ?

## 2022-04-01 ENCOUNTER — Ambulatory Visit: Payer: Medicaid Other | Attending: Urology

## 2022-04-01 DIAGNOSIS — M62838 Other muscle spasm: Secondary | ICD-10-CM | POA: Insufficient documentation

## 2022-04-01 DIAGNOSIS — R279 Unspecified lack of coordination: Secondary | ICD-10-CM | POA: Insufficient documentation

## 2022-04-01 DIAGNOSIS — M6281 Muscle weakness (generalized): Secondary | ICD-10-CM | POA: Diagnosis present
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
## 2022-04-08 ENCOUNTER — Ambulatory Visit: Payer: Medicaid Other

## 2022-04-18 ENCOUNTER — Ambulatory Visit (HOSPITAL_COMMUNITY)
Admission: EM | Admit: 2022-04-18 | Discharge: 2022-04-18 | Disposition: A | Discharge status: Home / Self Care | Payer: Medicaid Other | Attending: Family Medicine | Admitting: Family Medicine

## 2022-04-18 DIAGNOSIS — R110 Nausea: Secondary | ICD-10-CM

## 2022-04-18 DIAGNOSIS — R1013 Epigastric pain: Secondary | ICD-10-CM | POA: Diagnosis not present

## 2022-04-18 LAB — CBC WITH DIFFERENTIAL/PLATELET
Abs Immature Granulocytes: 0.03 10*3/uL (ref 0.00–0.07)
Basophils Absolute: 0 10*3/uL (ref 0.0–0.1)
Basophils Relative: 0 %
Eosinophils Absolute: 0.4 10*3/uL (ref 0.0–0.5)
Eosinophils Relative: 3 %
HCT: 39.3 % (ref 36.0–46.0)
Hemoglobin: 13.2 g/dL (ref 12.0–15.0)
Immature Granulocytes: 0 %
Lymphocytes Relative: 9 %
Lymphs Abs: 1.2 10*3/uL (ref 0.7–4.0)
MCH: 30.6 pg (ref 26.0–34.0)
MCHC: 33.6 g/dL (ref 30.0–36.0)
MCV: 91 fL (ref 80.0–100.0)
Monocytes Absolute: 0.8 10*3/uL (ref 0.1–1.0)
Monocytes Relative: 6 %
Neutro Abs: 11.4 10*3/uL — ABNORMAL HIGH (ref 1.7–7.7)
Neutrophils Relative %: 82 %
Platelets: 319 10*3/uL (ref 150–400)
RBC: 4.32 MIL/uL (ref 3.87–5.11)
RDW: 12.8 % (ref 11.5–15.5)
WBC: 13.8 10*3/uL — ABNORMAL HIGH (ref 4.0–10.5)
nRBC: 0 % (ref 0.0–0.2)

## 2022-04-18 LAB — COMPREHENSIVE METABOLIC PANEL
ALT: 28 U/L (ref 0–44)
AST: 76 U/L — ABNORMAL HIGH (ref 15–41)
Albumin: 3.6 g/dL (ref 3.5–5.0)
Alkaline Phosphatase: 46 U/L (ref 38–126)
Anion gap: 9 (ref 5–15)
BUN: 8 mg/dL (ref 6–20)
CO2: 26 mmol/L (ref 22–32)
Calcium: 8.9 mg/dL (ref 8.9–10.3)
Chloride: 100 mmol/L (ref 98–111)
Creatinine, Ser: 0.76 mg/dL (ref 0.44–1.00)
GFR, Estimated: >60 mL/min (ref >60)
Glucose, Bld: 135 mg/dL — ABNORMAL HIGH (ref 70–99)
Potassium: 3.5 mmol/L (ref 3.5–5.1)
Sodium: 135 mmol/L (ref 135–145)
Total Bilirubin: 0.8 mg/dL (ref 0.3–1.2)
Total Protein: 7.1 g/dL (ref 6.5–8.1)

## 2022-04-18 LAB — POCT URINALYSIS DIPSTICK, ED / UC
Bilirubin Urine: NEGATIVE
Glucose, UA: 500 mg/dL — AB
Hgb urine dipstick: NEGATIVE
Leukocytes,Ua: NEGATIVE
Nitrite: NEGATIVE
Protein, ur: NEGATIVE mg/dL
Specific Gravity, Urine: 1.02 (ref 1.005–1.030)
Urobilinogen, UA: 1 mg/dL (ref 0.0–1.0)
pH: 6 (ref 5.0–8.0)

## 2022-04-18 LAB — POC URINE PREG, ED: Preg Test, Ur: NEGATIVE

## 2022-04-18 LAB — LIPASE, BLOOD: Lipase: 26 U/L (ref 11–51)

## 2022-04-18 MED ORDER — ONDANSETRON 4 MG PO TBDP
4.0000 mg | ORAL_TABLET | Freq: Three times a day (TID) | ORAL | 0 refills | Status: AC | PRN
Start: 2022-04-18 — End: ?

## 2022-04-18 NOTE — Discharge Instructions (Addendum)
You have had labs (blood work) drawn today. We will call you with any significant abnormalities or if there is need to begin or change treatment or pursue further follow up.  You may also review your test results online through MyChart. If you do not have a MyChart account, instructions to sign up should be on your discharge paperwork.  

## 2022-04-18 NOTE — ED Triage Notes (Signed)
C/o nausea and abdominal pain x 2 days. She takes phenergan at night and this has not help.  ?

## 2022-04-20 ENCOUNTER — Encounter (HOSPITAL_COMMUNITY): Payer: Self-pay

## 2022-04-20 ENCOUNTER — Emergency Department (HOSPITAL_COMMUNITY)
Admission: EM | Admit: 2022-04-20 | Discharge: 2022-04-21 | Disposition: A | Discharge status: Home / Self Care | Payer: Medicaid Other | Attending: Emergency Medicine | Admitting: Emergency Medicine

## 2022-04-20 ENCOUNTER — Other Ambulatory Visit: Payer: Self-pay

## 2022-04-20 DIAGNOSIS — E119 Type 2 diabetes mellitus without complications: Secondary | ICD-10-CM | POA: Insufficient documentation

## 2022-04-20 DIAGNOSIS — K5900 Constipation, unspecified: Secondary | ICD-10-CM | POA: Diagnosis not present

## 2022-04-20 DIAGNOSIS — R1084 Generalized abdominal pain: Secondary | ICD-10-CM | POA: Insufficient documentation

## 2022-04-20 DIAGNOSIS — R112 Nausea with vomiting, unspecified: Secondary | ICD-10-CM | POA: Insufficient documentation

## 2022-04-20 DIAGNOSIS — Z794 Long term (current) use of insulin: Secondary | ICD-10-CM | POA: Insufficient documentation

## 2022-04-20 LAB — COMPREHENSIVE METABOLIC PANEL
ALT: 30 U/L (ref 0–44)
AST: 55 U/L — ABNORMAL HIGH (ref 15–41)
Albumin: 3.5 g/dL (ref 3.5–5.0)
Alkaline Phosphatase: 55 U/L (ref 38–126)
Anion gap: 8 (ref 5–15)
BUN: 7 mg/dL (ref 6–20)
CO2: 26 mmol/L (ref 22–32)
Calcium: 9 mg/dL (ref 8.9–10.3)
Chloride: 99 mmol/L (ref 98–111)
Creatinine, Ser: 0.76 mg/dL (ref 0.44–1.00)
GFR, Estimated: >60 mL/min (ref >60)
Glucose, Bld: 97 mg/dL (ref 70–99)
Potassium: 4 mmol/L (ref 3.5–5.1)
Sodium: 133 mmol/L — ABNORMAL LOW (ref 135–145)
Total Bilirubin: 0.3 mg/dL (ref 0.3–1.2)
Total Protein: 7.5 g/dL (ref 6.5–8.1)

## 2022-04-20 LAB — CBC WITH DIFFERENTIAL/PLATELET
Abs Immature Granulocytes: 0.02 10*3/uL (ref 0.00–0.07)
Basophils Absolute: 0 10*3/uL (ref 0.0–0.1)
Basophils Relative: 0 %
Eosinophils Absolute: 0.2 10*3/uL (ref 0.0–0.5)
Eosinophils Relative: 3 %
HCT: 40.1 % (ref 36.0–46.0)
Hemoglobin: 13.5 g/dL (ref 12.0–15.0)
Immature Granulocytes: 0 %
Lymphocytes Relative: 28 %
Lymphs Abs: 1.9 10*3/uL (ref 0.7–4.0)
MCH: 30.6 pg (ref 26.0–34.0)
MCHC: 33.7 g/dL (ref 30.0–36.0)
MCV: 90.9 fL (ref 80.0–100.0)
Monocytes Absolute: 0.7 10*3/uL (ref 0.1–1.0)
Monocytes Relative: 11 %
Neutro Abs: 4 10*3/uL (ref 1.7–7.7)
Neutrophils Relative %: 58 %
Platelets: 315 10*3/uL (ref 150–400)
RBC: 4.41 MIL/uL (ref 3.87–5.11)
RDW: 12.6 % (ref 11.5–15.5)
WBC: 6.9 10*3/uL (ref 4.0–10.5)
nRBC: 0 % (ref 0.0–0.2)

## 2022-04-20 LAB — I-STAT BETA HCG BLOOD, ED (MC, WL, AP ONLY): I-stat hCG, quantitative: <5 m[IU]/mL (ref <5)

## 2022-04-20 LAB — LIPASE, BLOOD: Lipase: 23 U/L (ref 11–51)

## 2022-04-20 LAB — CBG MONITORING, ED: Glucose-Capillary: 67 mg/dL — ABNORMAL LOW (ref 70–99)

## 2022-04-20 NOTE — ED Triage Notes (Signed)
Pt having N/V since Friday. Seen at urgent care. Zofran doesn't help. Symptoms have gotten worse. ?

## 2022-04-20 NOTE — ED Provider Triage Note (Signed)
Emergency Medicine Provider Triage Evaluation Note ? ?Chloe Peters , a 21 y.o. female  was evaluated in triage.  Pt complains of nausea, vomiting, abdominal pain, and dysuria.  Patient reports that her nausea, vomiting, and abdominal pain started on Friday.  Symptoms have been constant since then.  Patient reports that she has vomited multiple times in the last 24 hours.  Describes emesis as clear.  Pain is located to her epigastric region and does not radiate.  Pain is worse with touch and movement.  Patient reports that she started having dysuria today. ? ?Reports that blood sugars have been 90-100 over the last few days. ? ?Review of Systems  ?Positive: Nausea, vomiting, abdominal pain, dysuria ?Negative: Fever, chills, constipation, diarrhea, blood in stool, melena, urinary urgency, urinary frequency, vaginal pain, vaginal bleeding, vaginal discharge ? ?Physical Exam  ?BP 119/74   Pulse 76   Temp 98.1 ?F (36.7 ?C)   Resp 16   SpO2 100%  ?Gen:   Awake, no distress   ?Resp:  Normal effort  ?MSK:   Moves extremities without difficulty  ?Other:  Abdomen soft, nondistended, tenderness to epigastric area without guarding or rebound tenderness. ? ?Medical Decision Making  ?Medically screening exam initiated at 8:40 PM.  Appropriate orders placed.  [name] was informed that the remainder of the evaluation will be completed by another provider, this initial triage assessment does not replace that evaluation, and the importance of remaining in the ED until their evaluation is complete. ? ? ?  ?Haskel Schroeder, PA-C ?04/20/22 2042 ? ?

## 2022-04-20 NOTE — ED Provider Notes (Signed)
?[place] ? ? ?[id number] ?04/18/22 Arrival Time: [id number] ? ?ASSESSMENT & PLAN: ? ?1. Nausea without vomiting   ?2. Epigastric pain   ? ?Benign abdominal exam today. Comfortable with home observation. No indications for urgent abdominal/pelvic imaging at this time. Discussed. ?U/A with small ketones. Encourage hydration. Reports her blood sugars are controlled. ? ?Lab work pending. ? ?Meds ordered this encounter  ?Medications  ? ondansetron (ZOFRAN-ODT) 4 MG disintegrating tablet  ?  Sig: Take 1 tablet (4 mg total) by mouth every 8 (eight) hours as needed for nausea or vomiting.  ?  Dispense:  15 tablet  ?  Refill:  0  ? ? ? ?Discharge Instructions   ? ?  ?You have had labs (blood work) drawn today. We will call you with any significant abnormalities or if there is need to begin or change treatment or pursue further follow up. ? ?You may also review your test results online through [facility]. If you do not have a MyChart account, instructions to sign up should be on your discharge paperwork. ? ? ? ? Follow-up Information   ? ? [facility].   ?Specialty: Emergency Medicine ?Why: If symptoms worsen in any way. ?Contact information: ?[address] ?[id number] mc ?[place] ?[phone] ? ?  ?  ? ?  ?  ? ?  ? ?Reviewed expectations re: course of current medical issues. Questions answered. ?Outlined signs and symptoms indicating need for more acute intervention. ?Patient verbalized understanding. ?After Visit Summary given. ? ? ?SUBJECTIVE: ?History from: patient. ?Chloe Peters is a 21 y.o. female who presents with complaint of nausea without emesis and vague epigastric discomfort; gradual onset over past 2 days; stable and intermittent. Phenergan hasn't helped with nausea. No back pain. Normal bowel/bladder habits. Ambulatory. No recent travel. No new medications. ? ?Social History  ? ?Substance and Sexual Activity  ?Alcohol Use Never   ? ? ?Patient's last menstrual period was 03/18/2022 (approximate). ? ?Past Surgical History:  ?Procedure Laterality Date  ? NO PAST SURGERIES    ? ? ? ?OBJECTIVE: ? ?Vitals:  ? 04/18/22 1642  ?BP: 120/75  ?Pulse: 86  ?Resp: 16  ?Temp: 98.4 ?F (36.9 ?C)  ?TempSrc: Oral  ?  ?General appearance: alert, oriented, no acute distress ?HEENT: [place]; AT; oropharynx moist ?Lungs: unlabored respirations ?Abdomen: soft; without distention; mild  and poorly localized tenderness to palpation over epigastric area ; normal bowel sounds; without masses or organomegaly; without guarding or rebound tenderness ?Back: without reported CVA tenderness; FROM at waist ?Extremities: without LE edema; symmetrical; without gross deformities ?Skin: warm and dry ?Neurologic: normal gait ?Psychological: alert and cooperative; normal mood and affect ? ? ?Allergies  ?Allergen Reactions  ? Shellfish Allergy Anaphylaxis  ? ?                                            ?Past Medical History:  ?Diagnosis Date  ? Eczema   ? Type 1 diabetes ([facility])   ? Dx at age 7 years  ? ? ?Social History  ? ?Socioeconomic History  ? Marital status: Single  ?  Spouse name: Not on file  ? Number of children: Not on file  ? Years of education: Not on file  ? Highest education level: Not on file  ?Occupational History  ? Not on file  ?Tobacco Use  ?  Smoking status: Never  ? Smokeless tobacco: Never  ?Vaping Use  ? Vaping Use: Never used  ?Substance and Sexual Activity  ? Alcohol use: Never  ? Drug use: Never  ? Sexual activity: Yes  ?  Birth control/protection: None  ?Other Topics Concern  ? Not on file  ?Social History Narrative  ? Freshman at [employer] wants to study nursing  ? ?Social Determinants of Health  ? ?Financial Resource Strain: Not on file  ?Food Insecurity: Not on file  ?Transportation Needs: Not on file  ?Physical Activity: Not on file  ?Stress: Not on file  ?Social Connections: Not on file  ?Intimate Partner Violence: Not on file  ? ? ?Family History  ?Problem  Relation Age of Onset  ? Allergic rhinitis Mother   ? Diabetes Paternal Aunt   ? Diabetes type I Maternal Grandmother   ? Angioedema Neg Hx   ? Asthma Neg Hx   ? Eczema Neg Hx   ? Atopy Neg Hx   ? Immunodeficiency Neg Hx   ? Urticaria Neg Hx   ? ?  ?Vanessa Kick, MD ?04/20/22 0935 ? ?

## 2022-04-21 ENCOUNTER — Emergency Department (HOSPITAL_COMMUNITY): Payer: Medicaid Other

## 2022-04-21 MED ORDER — IOHEXOL 300 MG/ML  SOLN
100.0000 mL | Freq: Once | INTRAMUSCULAR | Status: AC | PRN
  Administered 2022-04-21: 100 mL via INTRAVENOUS

## 2022-04-21 MED ORDER — POLYETHYLENE GLYCOL 3350 17 G PO PACK: 17.0000 g | PACK | Freq: Every day | ORAL | 0 refills | Status: AC

## 2022-04-21 NOTE — ED Provider Notes (Signed)
?[facility] ?Provider Note ? ? ?CSN: [id number] ?Arrival date & time: 04/20/22  1928 ? ?  ? ?History ? ?Chief Complaint  ?Patient presents with  ? Abdominal Pain  ? ? ?Chloe Peters NoraDixon is a 21 y.o. female. ? ?Pt complains of lower abdominal pain.  Pt reports she has had for 3 days.  Pt reports she went to urgent care 3 days ago and was seen.  Pt reports increasing lower abdominal discomfort.  Pt is diabetic.  Pt has an insulin pump.  Pt reports her glucose levels have been normal  ? ?The history is provided by the patient. No language interpreter was used.  ?Abdominal Pain ?Pain location:  Generalized ?Pain quality: aching   ?Pain radiates to:  Does not radiate ?Pain severity:  Moderate ?Onset quality:  Gradual ?Duration:  3 days ?Timing:  Constant ?Progression:  Worsening ?Chronicity:  New ?Context: not sick contacts   ?Relieved by:  Nothing ?Worsened by:  Nothing ?Ineffective treatments:  None tried ?Associated symptoms: no fever   ?Risk factors: has not had multiple surgeries and no NSAID use   ? ?  ? ?Home Medications ?Prior to Admission medications   ?Medication Sig Start Date End Date Taking? Authorizing Provider  ?amoxicillin-clavulanate (AUGMENTIN) 875-125 MG tablet Take 1 tablet by mouth 2 (two) times daily. 02/03/22   Brock BadHarper, Charles A, MD  ?phenazopyridine (PYRIDIUM) 200 MG tablet Take 1 tablet (200 mg total) by mouth 3 (three) times daily as needed for pain (urethral spasm). 02/03/22   Brock BadHarper, Charles A, MD  ?polyethylene glycol (MIRALAX) 17 g packet Take 17 g by mouth daily. 04/21/22  Yes Elson AreasSofia, Levonia Wolfley K, PA-C  ?ACCU-CHEK FASTCLIX LANCETS MISC Check sugar 10 x daily 05/24/18   Casimiro NeedleJessup, Ashley Bashioum, MD  ?desonide (DESOWEN) 0.05 % ointment Apply 1 application topically 2 (two) times daily as needed. 10/17/21   Hetty BlendAmbs, Anne M, FNP  ?EPINEPHRINE 0.3 mg/0.3 mL IJ SOAJ injection INJECT 0.3 MG INTO THE MUSCLE AS NEEDED FOR ANAPHYLAXIS. 10/17/21   Hetty BlendAmbs, Anne M, FNP  ?gabapentin  (NEURONTIN) 100 MG capsule Take 100 mg by mouth 3 (three) times daily.    [provider]  ?Glucagon (BAQSIMI TWO PACK) 3 MG/DOSE POWD Place 1 application into the nose as needed. Use as directed if unconscious, unable to take food po, or having a seizure due to hypoglycemia 10/04/18   Casimiro NeedleJessup, Ashley Bashioum, MD  ?glucagon 1 MG injection Inject 1 mg IM into thigh for severe Hypoglycemia and patient unconscious and cannot eat or drink 07/13/18   Casimiro NeedleJessup, Ashley Bashioum, MD  ?glucose blood (ACCU-CHEK GUIDE) test strip Use to check BG 6 times daily 11/14/18   Casimiro NeedleJessup, Ashley Bashioum, MD  ?hydrOXYzine (ATARAX/VISTARIL) 25 MG tablet TAKE 1 TABLET BY MOUTH EVERYDAY AT BEDTIME 07/19/20   [provider]  ?insulin aspart (NOVOLOG) 100 UNIT/ML FlexPen USE AS DIRECTED BY MD, UP TO 50 UNITS DAILY. 03/06/19   Casimiro NeedleJessup, Ashley Bashioum, MD  ?insulin degludec (TRESIBA FLEXTOUCH) 100 UNIT/ML SOPN FlexTouch Pen Inject up to 50 units at bedtime and per care plan 11/14/18   Casimiro NeedleJessup, Ashley Bashioum, MD  ?Insulin Pen Needle (BD PEN NEEDLE NANO U/F) 32G X 4 MM MISC use with insulin pen to inject up to 6 times daily 08/09/19   Casimiro NeedleJessup, Ashley Bashioum, MD  ?linaclotide (LINZESS) 72 MCG capsule Take 72 mcg by mouth daily before breakfast.    [provider]  ?loratadine (CLARITIN) 10 MG tablet Take 1 tablet (10 mg  total) by mouth daily. 03/10/21   [name], FNP  ?norelgestromin-ethinyl estradiol [facility]) 150-35 MCG/24HR transdermal patch Place 1 patch onto the skin once a week. 01/22/22   Adam Phenix, MD  ?Olopatadine HCl 0.2 % SOLN 1 drop in each eye daily as needed 09/04/20   [name], MD  ?ondansetron (ZOFRAN-ODT) 4 MG disintegrating tablet Take 1 tablet (4 mg total) by mouth every 8 (eight) hours as needed for nausea or vomiting. 04/18/22   Mardella Layman, MD  ?   ? ?Allergies    ?Shellfish allergy   ? ?Review of Systems   ?Review of Systems  ?Constitutional:  Negative for fever.   ?Gastrointestinal:  Positive for abdominal pain.  ?All other systems reviewed and are negative. ? ?Physical Exam ?Updated Vital Signs ?BP (!) 112/57 (BP Location: Left Arm)   Pulse 83   Temp 98.3 ?F (36.8 ?C) (Oral)   Resp 17   Ht 5\' 3"  (1.6 m)   Wt 61.2 kg   LMP 03/19/2022   SpO2 100%   BMI 23.91 kg/m?  ?Physical Exam ?Vitals and nursing note reviewed.  ?Constitutional:   ?   Appearance: She is well-developed.  ?HENT:  ?   Head: Normocephalic.  ?Cardiovascular:  ?   Rate and Rhythm: Normal rate and regular rhythm.  ?Pulmonary:  ?   Effort: Pulmonary effort is normal.  ?Abdominal:  ?   General: Abdomen is flat. Bowel sounds are normal. There is no distension.  ?   Palpations: Abdomen is soft.  ?   Tenderness: There is abdominal tenderness.  ?Musculoskeletal:     ?   General: Normal range of motion.  ?   Cervical back: Normal range of motion.  ?Skin: ?   General: Skin is warm.  ?Neurological:  ?   Mental Status: She is alert and oriented to person, place, and time.  ?Psychiatric:     ?   Mood and Affect: Mood normal.  ? ? ?ED Results / Procedures / Treatments   ?Labs ?(all labs ordered are listed, but only abnormal results are displayed) ?Labs Reviewed  ?COMPREHENSIVE METABOLIC PANEL - Abnormal; Notable for the following components:  ?    Result Value  ? Sodium 133 (*)   ? AST 55 (*)   ? All other components within normal limits  ?CBG MONITORING, ED - Abnormal; Notable for the following components:  ? Glucose-Capillary 67 (*)   ? All other components within normal limits  ?URINE CULTURE  ?CBC WITH DIFFERENTIAL/PLATELET  ?LIPASE, BLOOD  ?URINALYSIS, ROUTINE W REFLEX MICROSCOPIC  ?I-STAT BETA HCG BLOOD, ED ([facility], [facility], [facility] ONLY)  ? ? ?EKG ?None ? ?Radiology ?CT ABDOMEN PELVIS W CONTRAST ? ?Result Date: 04/21/2022 ?CLINICAL DATA:  Abdominal pain in a 21 year old female. EXAM: CT ABDOMEN AND PELVIS WITH CONTRAST TECHNIQUE: Multidetector CT imaging of the abdomen and pelvis was performed using the standard protocol  following bolus administration of intravenous contrast. Exam compromised by motion on the portal venous phase due to emesis during the scan. RADIATION DOSE REDUCTION: This exam was performed according to the departmental dose-optimization program which includes automated exposure control, adjustment of the mA and/or kV according to patient size and/or use of iterative reconstruction technique. CONTRAST:  36 OMNIPAQUE IOHEXOL 300 MG/ML  SOLN COMPARISON:  Gastric emptying study of January 22, 2021. FINDINGS: Lower chest: Basilar atelectasis. Motion limited assessment of the lung bases due to emesis during the scan. No effusion. Hepatobiliary: No focal, suspicious hepatic lesion. No pericholecystic stranding.  No biliary duct dilation. Portal vein is patent. Pancreas: Normal, without mass, inflammation or ductal dilatation. Spleen: Normal spleen. Adrenals/Urinary Tract: Adrenal glands are unremarkable. Symmetric renal enhancement. No sign of hydronephrosis. No suspicious renal lesion or perinephric stranding. Urinary bladder is grossly unremarkable. Stomach/Bowel: No signs of acute small bowel process. No stranding adjacent to the stomach. Moderate stool in the colon mainly in the ascending colon and the rectum. No pericolonic stranding. Appendix not definitely seen, no secondary signs to suggest acute appendicitis in the area of the cecum. Vascular/Lymphatic: Aorta with smooth contours. IVC with smooth contours. No aneurysmal dilation of the abdominal aorta. There is no gastrohepatic or hepatoduodenal ligament lymphadenopathy. No retroperitoneal or mesenteric lymphadenopathy. No pelvic sidewall lymphadenopathy. Reproductive: Unremarkable by CT. Other: No free air.  No ascites. Musculoskeletal: No acute or significant osseous findings. IMPRESSION: 1. No CT evidence of acute intra-abdominal pathology. 2. Moderate stool in the colon mainly in the ascending colon and the rectum. Correlate with any clinical evidence of  constipation. 3. Appendix not definitely seen, no secondary signs to suggest acute appendicitis in the area of the cecum. Electronically Signed   By: Donzetta Kohut M.D.   On: 04/21/2022 10:26   ? ?Procedures ?Procedures  ? ? ?

## 2022-04-21 NOTE — Discharge Instructions (Addendum)
See your Physicain for recheck in 2-3 days °

## 2022-04-21 NOTE — ED Notes (Signed)
Reviewed discharge instructions with patient. Follow-up care and medications reviewed. Patient  verbalized understanding. Patient A&Ox4, VSS, and ambulatory with steady gait upon discharge.  °

## 2022-04-21 NOTE — ED Notes (Signed)
Assumed pt care at this time

## 2022-04-22 ENCOUNTER — Ambulatory Visit: Payer: Medicaid Other

## 2022-04-22 DIAGNOSIS — M6281 Muscle weakness (generalized): Secondary | ICD-10-CM

## 2022-04-22 DIAGNOSIS — M62838 Other muscle spasm: Secondary | ICD-10-CM | POA: Diagnosis not present

## 2022-04-22 DIAGNOSIS — R279 Unspecified lack of coordination: Secondary | ICD-10-CM
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
## 2022-05-06 ENCOUNTER — Ambulatory Visit: Payer: Medicaid Other | Attending: Urology

## 2022-05-06 DIAGNOSIS — R279 Unspecified lack of coordination: Secondary | ICD-10-CM | POA: Diagnosis present

## 2022-05-06 DIAGNOSIS — M62838 Other muscle spasm: Secondary | ICD-10-CM | POA: Insufficient documentation

## 2022-05-06 DIAGNOSIS — M6281 Muscle weakness (generalized): Secondary | ICD-10-CM | POA: Diagnosis present
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
## 2022-05-06 NOTE — Patient Instructions (Signed)
Bladder/bowel retraining: ? ?8-12 grams of fiber with each meal ?After each meal, go attempt to have a bowel movement within 5 minutes and don?t sit on the ?toilet for more than 5 minutes ?Drink 4-8oz an hour ? ?For two weeks. ? ?Overnight oats with added ground flax seed and chia seeds. Start with just a teaspoon of each of these and increase from there if needed.  ?

## 2022-05-13 ENCOUNTER — Ambulatory Visit: Payer: Medicaid Other

## 2022-05-13 DIAGNOSIS — R279 Unspecified lack of coordination: Secondary | ICD-10-CM

## 2022-05-13 DIAGNOSIS — M6281 Muscle weakness (generalized): Secondary | ICD-10-CM | POA: Diagnosis not present

## 2022-05-13 DIAGNOSIS — M62838 Other muscle spasm: Secondary | ICD-10-CM
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
## 2022-05-28 ENCOUNTER — Telehealth: Payer: Self-pay

## 2022-05-28 ENCOUNTER — Ambulatory Visit: Payer: Medicaid Other | Attending: Urology

## 2022-05-28 DIAGNOSIS — R279 Unspecified lack of coordination: Secondary | ICD-10-CM | POA: Insufficient documentation

## 2022-05-28 DIAGNOSIS — M62838 Other muscle spasm: Secondary | ICD-10-CM | POA: Insufficient documentation

## 2022-05-28 DIAGNOSIS — M6281 Muscle weakness (generalized): Secondary | ICD-10-CM | POA: Insufficient documentation

## 2022-05-28 NOTE — Telephone Encounter (Signed)
Spoke with patient about no-show at 3:30 on 05/28/22 - she got stuck at work and forgot to call. This was her last scheduled appointment, but she would really like to make more appointments. Due to being at work, she will call tomorrow morning to schedule.
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
## 2022-06-22 ENCOUNTER — Ambulatory Visit (HOSPITAL_COMMUNITY)
Admission: EM | Admit: 2022-06-22 | Discharge: 2022-06-22 | Disposition: A | Discharge status: Home / Self Care | Payer: Medicaid Other | Attending: Sports Medicine | Admitting: Sports Medicine

## 2022-06-22 ENCOUNTER — Encounter (HOSPITAL_COMMUNITY): Payer: Self-pay | Admitting: Emergency Medicine

## 2022-06-22 DIAGNOSIS — S61211A Laceration without foreign body of left index finger without damage to nail, initial encounter: Secondary | ICD-10-CM

## 2022-07-06 ENCOUNTER — Encounter (HOSPITAL_COMMUNITY): Payer: Self-pay

## 2022-07-06 ENCOUNTER — Ambulatory Visit (HOSPITAL_COMMUNITY)
Admission: RE | Admit: 2022-07-06 | Discharge: 2022-07-06 | Disposition: A | Discharge status: Home / Self Care | Payer: Medicaid Other | Source: Ambulatory Visit | Attending: Internal Medicine | Admitting: Internal Medicine

## 2022-07-06 VITALS — BP 106/67 | HR 73 | Temp 98.3°F | Resp 14

## 2022-07-06 DIAGNOSIS — N12 Tubulo-interstitial nephritis, not specified as acute or chronic: Secondary | ICD-10-CM | POA: Insufficient documentation

## 2022-07-06 DIAGNOSIS — E1069 Type 1 diabetes mellitus with other specified complication: Secondary | ICD-10-CM

## 2022-07-06 DIAGNOSIS — E109 Type 1 diabetes mellitus without complications: Secondary | ICD-10-CM | POA: Diagnosis present

## 2022-07-06 LAB — CBC WITH DIFFERENTIAL/PLATELET
Abs Immature Granulocytes: 0.03 10*3/uL (ref 0.00–0.07)
Basophils Absolute: 0.1 10*3/uL (ref 0.0–0.1)
Basophils Relative: 1 %
Eosinophils Absolute: 0.7 10*3/uL — ABNORMAL HIGH (ref 0.0–0.5)
Eosinophils Relative: 8 %
HCT: 37.9 % (ref 36.0–46.0)
Hemoglobin: 12.9 g/dL (ref 12.0–15.0)
Immature Granulocytes: 0 %
Lymphocytes Relative: 31 %
Lymphs Abs: 2.6 10*3/uL (ref 0.7–4.0)
MCH: 30.6 pg (ref 26.0–34.0)
MCHC: 34 g/dL (ref 30.0–36.0)
MCV: 90 fL (ref 80.0–100.0)
Monocytes Absolute: 0.5 10*3/uL (ref 0.1–1.0)
Monocytes Relative: 6 %
Neutro Abs: 4.5 10*3/uL (ref 1.7–7.7)
Neutrophils Relative %: 54 %
Platelets: 309 10*3/uL (ref 150–400)
RBC: 4.21 MIL/uL (ref 3.87–5.11)
RDW: 12.9 % (ref 11.5–15.5)
WBC: 8.4 10*3/uL (ref 4.0–10.5)
nRBC: 0 % (ref 0.0–0.2)

## 2022-07-06 LAB — POCT URINALYSIS DIPSTICK, ED / UC
Bilirubin Urine: NEGATIVE
Glucose, UA: 500 mg/dL — AB
Hgb urine dipstick: NEGATIVE
Ketones, ur: 15 mg/dL — AB
Nitrite: POSITIVE — AB
Protein, ur: NEGATIVE mg/dL
Specific Gravity, Urine: 1.015 (ref 1.005–1.030)
Urobilinogen, UA: 2 mg/dL — ABNORMAL HIGH (ref 0.0–1.0)
pH: 5 (ref 5.0–8.0)

## 2022-07-06 LAB — BASIC METABOLIC PANEL
Anion gap: 8 (ref 5–15)
BUN: <5 mg/dL — ABNORMAL LOW (ref 6–20)
CO2: 27 mmol/L (ref 22–32)
Calcium: 9.2 mg/dL (ref 8.9–10.3)
Chloride: 103 mmol/L (ref 98–111)
Creatinine, Ser: 0.84 mg/dL (ref 0.44–1.00)
GFR, Estimated: >60 mL/min (ref >60)
Glucose, Bld: 187 mg/dL — ABNORMAL HIGH (ref 70–99)
Potassium: 4.2 mmol/L (ref 3.5–5.1)
Sodium: 138 mmol/L (ref 135–145)

## 2022-07-06 LAB — POC URINE PREG, ED: Preg Test, Ur: NEGATIVE

## 2022-07-06 MED ORDER — LIDOCAINE HCL (PF) 1 % IJ SOLN
INTRAMUSCULAR | Status: AC
  Filled 2022-07-06: qty 4

## 2022-07-06 MED ORDER — CEFTRIAXONE SODIUM 1 G IJ SOLR
INTRAMUSCULAR | Status: AC
  Filled 2022-07-06: qty 10

## 2022-07-06 MED ORDER — CEFTRIAXONE SODIUM 1 G IJ SOLR
1.0000 g | Freq: Once | INTRAMUSCULAR | Status: AC
  Administered 2022-07-06: 1 g via INTRAMUSCULAR

## 2022-07-06 MED ORDER — CIPROFLOXACIN HCL 500 MG PO TABS: 500.0000 mg | ORAL_TABLET | Freq: Two times a day (BID) | ORAL | 0 refills | Status: DC

## 2022-07-06 NOTE — Discharge Instructions (Signed)
I am concerned that you have a urinary tract infection and is setting up towards her kidney.  I will contact you tomorrow if your lab work is abnormal we need to change her treatment plan.  We gave an injection of antibiotics today.  Please start ciprofloxacin twice daily.  If you develop any pain in your joints or muscles you need to stop the medication and be seen.  Make sure you are drinking lots of fluids.  Monitor your blood sugar closely.  Use your ketone strips as we discussed.  Follow-up with urology; call to schedule appointment.  If anything worsens you need to go to the emergency room overnight.

## 2022-07-06 NOTE — ED Triage Notes (Signed)
Pt presents with c/o dysuria, urinary urgency, and urinary frequency x 2 days. Pt states she has been taking pyridium.

## 2022-07-06 NOTE — ED Provider Notes (Signed)
[facility]    CSN: [id number] Arrival date & time: 07/06/22  1833      History   Chief Complaint Chief Complaint  Patient presents with   Urinary Frequency    For the past 2 days I have been experiencing constant urinary frequency, burning, and urgency. nausea, and abdominal pain. I have been taking over the counter medications such as pyridium, aleve, and motrin but nothing seems to help. I have PFD. - Entered by patient    HPI Chloe Peters is a 21 y.o. female.   Patient presents today with a 2-day history of UTI symptoms.  She reports dysuria, urinary frequency, urinary urgency, nausea lower abdominal pain.  Denies any fever, vomiting, chest pain, shortness of breath.  She does have a history of pelvic floor dysfunction and often has pelvic and abdominal pain but reports this is increased from baseline.  She has been taking Azo without Relief of symptoms.  She denies any recent antibiotic use.  She does have a history of type 1 diabetes and reports her blood sugars have been slightly elevated around 200.  She is typically very well controlled.  She does have ketone strips at home but has not been monitoring this.  She denies any recent urogenital procedure, self catheterization.  Denies any increased pelvic pain or vaginal discharge.    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 Authorizing Provider  ciprofloxacin (CIPRO) 500 MG tablet Take 1 tablet (500 mg total) by mouth every 12 (twelve) hours. 07/06/22  Yes Icis Budreau, Noberto Retort, PA-C  ACCU-CHEK FASTCLIX LANCETS MISC Check sugar 10 x daily 05/24/18   [name], MD  desonide (DESOWEN) 0.05 % ointment Apply 1 application topically 2 (two) times daily as needed. 10/17/21   Ambs, Norvel Richards, FNP  EPINEPHRINE 0.3 mg/0.3 mL IJ SOAJ injection INJECT 0.3 MG INTO THE MUSCLE AS NEEDED FOR ANAPHYLAXIS. 10/17/21   Hetty Blend, FNP  gabapentin (NEURONTIN) 100 MG capsule Take 100 mg by mouth 3 (three) times daily.    [provider]  Glucagon (BAQSIMI TWO PACK) 3 MG/DOSE POWD Place 1 application into the nose as needed. Use as directed if unconscious, unable to take food po, or having a seizure due to hypoglycemia 10/04/18   [name], MD  glucagon 1 MG injection  Inject 1 mg IM into thigh for severe Hypoglycemia and patient unconscious and cannot eat or drink 07/13/18   [name], MD  glucose blood (ACCU-CHEK GUIDE) test strip Use to check BG 6 times daily 11/14/18   [name], MD  hydrOXYzine (ATARAX/VISTARIL) 25 MG tablet TAKE 1 TABLET BY MOUTH EVERYDAY AT BEDTIME 07/19/20   [provider]  insulin aspart (NOVOLOG) 100 UNIT/ML FlexPen USE AS DIRECTED BY MD, UP TO 50 UNITS DAILY. 03/06/19   [name], MD  insulin degludec (TRESIBA FLEXTOUCH) 100 UNIT/ML SOPN FlexTouch Pen Inject up to 50 units at bedtime and per care plan 11/14/18   [name], MD  Insulin Pen Needle (BD PEN NEEDLE NANO U/F) 32G X 4 MM MISC use with insulin pen to inject up to 6 times daily 08/09/19   [name], MD  linaclotide [facility]) 72  MCG capsule Take 72 mcg by mouth daily before breakfast.    [provider]  loratadine (CLARITIN) 10 MG tablet Take 1 tablet (10 mg total) by mouth daily. 03/10/21   [name], FNP  norelgestromin-ethinyl estradiol [facility]) 150-35 MCG/24HR transdermal patch Place 1 patch onto the skin once a week. 01/22/22   Adam Phenix, MD  Olopatadine HCl 0.2 % SOLN 1 drop in each eye daily as needed 09/04/20   [name], MD  ondansetron (ZOFRAN-ODT) 4 MG disintegrating tablet Take 1 tablet (4 mg total) by mouth every 8 (eight) hours as needed for nausea or vomiting. 04/18/22   Mardella Layman, MD  phenazopyridine (PYRIDIUM) 200 MG tablet Take 1 tablet (200 mg total) by mouth 3 (three) times daily as needed for pain (urethral spasm). 02/03/22   Brock Bad, MD  polyethylene glycol (MIRALAX) 17 g packet Take 17 g by mouth daily. 04/21/22   Elson Areas, PA-C    Family History Family History  Problem Relation Age of Onset   Allergic rhinitis Mother    Diabetes type I Maternal Grandmother    Diabetes Paternal Aunt    Angioedema Neg Hx    Asthma Neg Hx    Eczema Neg Hx    Atopy Neg Hx    Immunodeficiency Neg Hx    Urticaria Neg Hx     Social History Social History   Tobacco Use   Smoking status: Never   Smokeless tobacco: Never  Vaping Use   Vaping Use: Never used  Substance Use Topics   Alcohol use: Never   Drug use: Never     Allergies   Shellfish allergy   Review of Systems Review of Systems  Constitutional:  Positive for activity change. Negative for appetite change, fatigue and fever.  Respiratory:  Negative for cough and shortness of breath.   Cardiovascular:  Negative for chest pain.  Gastrointestinal:  Positive for abdominal pain and nausea. Negative for diarrhea and vomiting.  Genitourinary:  Positive for dysuria, frequency and urgency. Negative for hematuria, vaginal bleeding, vaginal discharge and vaginal pain.  Neurological:  Negative for  dizziness, light-headedness and headaches.     Physical Exam Triage Vital Signs ED Triage Vitals  Enc Vitals Group     BP 07/06/22 1918 106/67     Pulse Rate 07/06/22 1918 73     Resp 07/06/22 1918 14     Temp 07/06/22 1918 98.3 F (36.8 C)     Temp Source 07/06/22 1918 Oral     SpO2 07/06/22 1918 99 %     Weight --  Height --      Head Circumference --      Peak Flow --      Pain Score 07/06/22 1917 5     Pain Loc --      Pain Edu? --      Excl. in [place]? --    No data found.  Updated Vital Signs BP 106/67 (BP Location: Right Arm)   Pulse 73   Temp 98.3 F (36.8 C) (Oral)   Resp 14   LMP 05/27/2022   SpO2 99%   Visual Acuity Right Eye Distance:   Left Eye Distance:   Bilateral Distance:    Right Eye Near:   Left Eye Near:    Bilateral Near:     Physical Exam Vitals reviewed.  Constitutional:      General: She is awake. She is not in acute distress.    Appearance: Normal appearance. She is well-developed. She is not ill-appearing.     Comments: Very pleasant female appears stated age in no acute distress sitting comfortably in exam room  HENT:     Head: Normocephalic and atraumatic.  Cardiovascular:     Rate and Rhythm: Normal rate and regular rhythm.     Heart sounds: Normal heart sounds, S1 normal and S2 normal. No murmur heard. Pulmonary:     Effort: Pulmonary effort is normal.     Breath sounds: Normal breath sounds. No wheezing, rhonchi or rales.     Comments: Clear to auscultation bilaterally Abdominal:     General: Bowel sounds are normal.     Palpations: Abdomen is soft.     Tenderness: There is no abdominal tenderness. There is left CVA tenderness. There is no right CVA tenderness, guarding or rebound.  Psychiatric:        Behavior: Behavior is cooperative.      UC Treatments / Results  Labs (all labs ordered are listed, but only abnormal results are displayed) Labs Reviewed  POCT URINALYSIS DIPSTICK, ED / UC - Abnormal; Notable for  the following components:      Result Value   Glucose, UA 500 (*)    Ketones, ur 15 (*)    Urobilinogen, UA 2.0 (*)    Nitrite POSITIVE (*)    Leukocytes,Ua LARGE (*)    All other components within normal limits  URINE CULTURE  CBC WITH DIFFERENTIAL/PLATELET  BASIC METABOLIC PANEL  POC URINE PREG, ED    EKG   Radiology No results found.  Procedures Procedures (including critical care time)  Medications Ordered in UC Medications  cefTRIAXone (ROCEPHIN) injection 1 g (1 g Intramuscular Given 07/06/22 1944)    Initial Impression / Assessment and Plan / UC Course  I have reviewed the triage vital signs and the nursing notes.  Pertinent labs & imaging results that were available during my care of the patient were reviewed by me and considered in my medical decision making (see chart for details).     Patient is well-appearing, afebrile, nontoxic, nontachycardic.  UA did show evidence of infection.  She had trace ketones.  Discussed that she should monitor this at home with ketone strips and if this significantly increases or changes she needs to go to the hospital as she is at risk for ketoacidosis due to acute illness.  Concern for pyelonephritis given clinical presentation.  Patient was given 1 g of Rocephin in clinic.  She was started on ciprofloxacin 500 mg twice daily for 7 days.  Recommended that she follow-up with urology first  thing tomorrow.  She was given their contact information with instruction to call to schedule an appointment.  Urine culture was obtained and we discussed the potential need to change antibiotics based on susceptibilities identified on culture.  CBC and BMP obtained.  If she has significant leukocytosis or acute kidney injury she would need to go to the emergency room.  Discussed that if she has any worsening symptoms including nausea/vomiting interfering with oral intake, fever, severe abdominal pain, ketonuria she needs to go to the emergency room  immediately to which she expressed understanding.  Strict return precautions given.  Work excuse note provided.  Final Clinical Impressions(s) / UC Diagnoses   Final diagnoses:  Pyelonephritis  Type 1 diabetes mellitus without complication [facility])     Discharge Instructions      I am concerned that you have a urinary tract infection and is setting up towards her kidney.  I will contact you tomorrow if your lab work is abnormal we need to change her treatment plan.  We gave an injection of antibiotics today.  Please start ciprofloxacin twice daily.  If you develop any pain in your joints or muscles you need to stop the medication and be seen.  Make sure you are drinking lots of fluids.  Monitor your blood sugar closely.  Use your ketone strips as we discussed.  Follow-up with urology; call to schedule appointment.  If anything worsens you need to go to the emergency room overnight.     ED Prescriptions     Medication Sig Dispense Auth. Provider   ciprofloxacin (CIPRO) 500 MG tablet Take 1 tablet (500 mg total) by mouth every 12 (twelve) hours. 14 tablet Gradyn Shein, Noberto Retort, PA-C      PDMP not reviewed this encounter.   Jeani Hawking, PA-C 07/06/22 2015

## 2022-07-07 LAB — URINE CULTURE: Culture: NO GROWTH

## 2022-07-08 ENCOUNTER — Ambulatory Visit (HOSPITAL_COMMUNITY): Payer: Self-pay

## 2022-07-08 ENCOUNTER — Encounter (HOSPITAL_COMMUNITY): Payer: Self-pay | Admitting: Emergency Medicine

## 2022-07-08 ENCOUNTER — Ambulatory Visit (HOSPITAL_COMMUNITY)
Admission: EM | Admit: 2022-07-08 | Discharge: 2022-07-08 | Disposition: A | Discharge status: Home / Self Care | Payer: Medicaid Other | Attending: Urgent Care | Admitting: Urgent Care

## 2022-07-08 DIAGNOSIS — N730 Acute parametritis and pelvic cellulitis: Secondary | ICD-10-CM | POA: Diagnosis not present

## 2022-07-08 DIAGNOSIS — R300 Dysuria: Secondary | ICD-10-CM | POA: Diagnosis not present

## 2022-07-08 DIAGNOSIS — R110 Nausea: Secondary | ICD-10-CM | POA: Diagnosis not present

## 2022-07-08 LAB — POCT URINALYSIS DIPSTICK, ED / UC
Bilirubin Urine: NEGATIVE
Glucose, UA: 250 mg/dL — AB
Hgb urine dipstick: NEGATIVE
Ketones, ur: 40 mg/dL — AB
Nitrite: NEGATIVE
Protein, ur: NEGATIVE mg/dL
Specific Gravity, Urine: 1.01 (ref 1.005–1.030)
Urobilinogen, UA: 0.2 mg/dL (ref 0.0–1.0)
pH: 6 (ref 5.0–8.0)

## 2022-07-08 MED ORDER — METRONIDAZOLE 500 MG PO TABS: 500.0000 mg | ORAL_TABLET | Freq: Two times a day (BID) | ORAL | 0 refills | Status: AC

## 2022-07-08 MED ORDER — CEFTRIAXONE SODIUM 1 G IJ SOLR
INTRAMUSCULAR | Status: AC
  Filled 2022-07-08: qty 10

## 2022-07-08 MED ORDER — LIDOCAINE HCL (PF) 1 % IJ SOLN
INTRAMUSCULAR | Status: AC
  Filled 2022-07-08: qty 2

## 2022-07-08 MED ORDER — CEFTRIAXONE SODIUM 1 G IJ SOLR
1.0000 g | Freq: Once | INTRAMUSCULAR | Status: AC
  Administered 2022-07-08: 1 g via INTRAMUSCULAR

## 2022-07-08 MED ORDER — DOXYCYCLINE HYCLATE 100 MG PO CAPS: 100.0000 mg | ORAL_CAPSULE | Freq: Two times a day (BID) | ORAL | 0 refills | Status: AC

## 2022-07-08 NOTE — ED Provider Notes (Signed)
[facility]    CSN: [id number] Arrival date & time: 07/08/22  1648      History   Chief Complaint Chief Complaint  Patient presents with   Dysuria   APPT 1700    HPI Tiyah C. Cocke is a 21 y.o. female.   Pleasant 21 year old female presents today for a recheck.  She was seen on 07/06/2022 with suspected developing pyelonephritis and sent home on Cipro.  She had a urine culture returned completely normal however and was told to stop the antibiotic.  Patient states the only symptom that resolved on the treatment was her back pain.  She is still having severe pelvic cramping, urinary frequency, dysuria, and nausea.  She also reports upper abdominal discomfort.  She states she has had the abdominal symptoms and pelvic symptoms for several months, but severely worsened over the past 4 days.  She denies any diarrhea or vomiting.  Has had several months worth of nausea, but recently increased in severity. No fever. Her blood sugars average around 150.  She has had no new change in medications apart from the Cipro 2 days ago.  She denies any known vaginal discharge or pruritus. She has been dx with pelvic floor syndrome in the past, being treated with PT.   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 Authorizing Provider  doxycycline (VIBRAMYCIN) 100 MG capsule Take 1 capsule (100 mg total) by mouth 2 (two) times daily for 14 days. 07/08/22 07/22/22 Yes Janele Lague L, PA  hydrOXYzine (ATARAX/VISTARIL) 25 MG tablet TAKE 1 TABLET BY MOUTH EVERYDAY AT BEDTIME 07/19/20  Yes [provider]  insulin aspart (NOVOLOG) 100 UNIT/ML FlexPen USE AS DIRECTED BY MD, UP TO 50 UNITS DAILY. 03/06/19  Yes Jessup, Audley Hose, MD  Insulin Pen Needle (BD PEN NEEDLE NANO U/F) 32G X 4 MM MISC use with insulin pen to inject up to 6 times daily 08/09/19  Yes Jessup, Audley Hose, MD  linaclotide [facility]) 72 MCG capsule Take 72 mcg by mouth daily before breakfast.   Yes [provider]  loratadine (CLARITIN) 10 MG tablet Take 1 tablet (10 mg total) by mouth daily. 03/10/21  Yes [name], FNP  metroNIDAZOLE (FLAGYL) 500 MG tablet Take 1 tablet (500 mg total) by mouth 2 (two) times daily for 14 days.  07/08/22 07/22/22 Yes Kennen Stammer L, PA  norelgestromin-ethinyl estradiol [facility]) 150-35 MCG/24HR transdermal patch Place 1 patch onto the skin once a week. 01/22/22  Yes Adam Phenix, MD  phenazopyridine (PYRIDIUM) 200 MG tablet Take 1 tablet (200 mg total) by mouth 3 (three) times daily as needed for pain (urethral spasm). 02/03/22  Yes Brock Bad, MD  ACCU-CHEK FASTCLIX LANCETS MISC Check sugar 10 x daily 05/24/18   [name], MD  desonide (DESOWEN) 0.05 % ointment Apply 1 application topically 2 (two) times daily as needed. 10/17/21   Ambs, Norvel Richards, FNP  EPINEPHRINE 0.3 mg/0.3 mL IJ SOAJ injection INJECT 0.3 MG INTO THE MUSCLE AS NEEDED FOR ANAPHYLAXIS. 10/17/21   Hetty Blend, FNP  gabapentin (NEURONTIN) 100 MG capsule Take 100 mg by mouth 3 (three) times daily.    [provider]  Glucagon (BAQSIMI TWO PACK) 3  MG/DOSE POWD Place 1 application into the nose as needed. Use as directed if unconscious, unable to take food po, or having a seizure due to hypoglycemia 10/04/18   [name], MD  glucagon 1 MG injection Inject 1 mg IM into thigh for severe Hypoglycemia and patient unconscious and cannot eat or drink 07/13/18   [name], MD  glucose blood (ACCU-CHEK GUIDE) test strip Use to check BG 6 times daily 11/14/18   [name], MD  insulin degludec (TRESIBA FLEXTOUCH) 100 UNIT/ML SOPN FlexTouch Pen Inject up to 50 units at bedtime and per care plan 11/14/18   [name], MD  Olopatadine HCl 0.2 % SOLN 1 drop in each eye daily as needed 09/04/20   [name], MD  ondansetron (ZOFRAN-ODT) 4 MG disintegrating tablet Take 1 tablet (4 mg total) by mouth every 8 (eight) hours as needed for nausea or vomiting. 04/18/22   Mardella Layman, MD  polyethylene glycol (MIRALAX) 17 g packet Take 17 g by mouth daily. 04/21/22   Elson Areas, PA-C    Family History Family History  Problem Relation Age of Onset   Allergic rhinitis Mother    Diabetes type I Maternal Grandmother    Diabetes Paternal Aunt    Angioedema Neg Hx    Asthma Neg Hx    Eczema Neg Hx    Atopy Neg Hx    Immunodeficiency Neg Hx    Urticaria Neg Hx     Social History Social History   Tobacco Use   Smoking status: Never   Smokeless tobacco: Never  Vaping Use   Vaping Use: Never used  Substance Use Topics   Alcohol use: Never   Drug use: Never     Allergies   Shellfish allergy   Review of Systems Review of Systems  Genitourinary:  Positive for dysuria.  As per HPI   Physical Exam Triage Vital Signs ED Triage Vitals  Enc Vitals Group     BP 07/08/22 1703 (!) 114/59     Pulse Rate 07/08/22 1703 81     Resp 07/08/22 1703 16     Temp 07/08/22 1703 98.6 F (37 C)     Temp Source 07/08/22 1703 Oral     SpO2 07/08/22 1703 100 %     Weight --      Height --       Head Circumference --      Peak Flow --      Pain Score 07/08/22 1707 7     Pain Loc --  Pain Edu? --      Excl. in [place]? --    No data found.  Updated Vital Signs BP (!) 114/59 (BP Location: Left Arm)   Pulse 81   Temp 98.6 F (37 C) (Oral)   Resp 16   LMP 05/27/2022   SpO2 100%   Visual Acuity Right Eye Distance:   Left Eye Distance:   Bilateral Distance:    Right Eye Near:   Left Eye Near:    Bilateral Near:     Physical Exam Vitals and nursing note reviewed. Chaperone present: deferred.  Constitutional:      General: She is not in acute distress.    Appearance: Normal appearance. She is well-developed and normal weight. She is not ill-appearing, toxic-appearing or diaphoretic.  HENT:     Head: Normocephalic and atraumatic.     Nose: No congestion or rhinorrhea.     Mouth/Throat:     Mouth: Mucous membranes are moist.  Eyes:     Extraocular Movements: Extraocular movements intact.     Conjunctiva/sclera: Conjunctivae normal.     Pupils: Pupils are equal, round, and reactive to light.  Cardiovascular:     Rate and Rhythm: Normal rate and regular rhythm.     Heart sounds: No murmur heard. Pulmonary:     Effort: Pulmonary effort is normal. No respiratory distress.     Breath sounds: No wheezing.  Abdominal:     General: Abdomen is flat. Bowel sounds are normal. There is no distension. There are no signs of injury.     Palpations: Abdomen is soft. There is no shifting dullness, fluid wave, hepatomegaly, splenomegaly, mass or pulsatile mass.     Tenderness: There is abdominal tenderness (mild RUQ pain with borderline murphy sign) in the suprapubic area. There is no right CVA tenderness, left CVA tenderness, guarding or rebound. Negative signs include Murphy's sign, Rovsing's sign, McBurney's sign, psoas sign and obturator sign.     Hernia: No hernia is present.  Genitourinary:    General: Normal vulva.     Pubic Area: No rash or pubic lice.      Labia:         Right: No rash, tenderness, lesion or injury.        Left: No rash, tenderness, lesion or injury.      Urethra: No prolapse, urethral pain, urethral swelling or urethral lesion.     Vagina: No signs of injury and foreign body. Vaginal discharge present. No erythema, tenderness, bleeding, lesions or prolapsed vaginal walls.     Cervix: Cervical motion tenderness and friability present. No discharge, lesion, erythema or eversion.     Uterus: Normal. Tender. Not deviated, not enlarged, not fixed and no uterine prolapse.      Adnexa:        Right: Tenderness present. No mass or fullness.         Left: Tenderness present. No mass or fullness.       Rectum: Normal.  Lymphadenopathy:     Cervical: No cervical adenopathy.  Neurological:     Mental Status: She is alert.      UC Treatments / Results  Labs (all labs ordered are listed, but only abnormal results are displayed) Labs Reviewed  POCT URINALYSIS DIPSTICK, ED / UC - Abnormal; Notable for the following components:      Result Value   Glucose, UA 250 (*)    Ketones, ur 40 (*)    Leukocytes,Ua TRACE (*)    All other  components within normal limits  CERVICOVAGINAL ANCILLARY ONLY    EKG   Radiology No results found.  Procedures Procedures (including critical care time)  Medications Ordered in UC Medications  cefTRIAXone (ROCEPHIN) injection 1 g (1 g Intramuscular Given 07/08/22 1837)    Initial Impression / Assessment and Plan / UC Course  I have reviewed the triage vital signs and the nursing notes.  Pertinent labs & imaging results that were available during my care of the patient were reviewed by me and considered in my medical decision making (see chart for details).     PID - clinically suspect PID. Flank pain resolved with cipro, however pt stopped taking it due to normal urine culture.  Patient had cervical motion tenderness and significant vaginal discharge.  We will treat her clinically for PID, IM injection of  Rocephin here, twice daily Doxy and metronidazole.  Patient admits to unprotected sex recently. Nausea -Per patient, this has been chronic.  Did worsen recently, likely secondary to #1.  Recommended patient had to the ER if she develops vomiting, fever, or worsening pain. Dysuria -possibly secondary to #1.  Patient does have pelvic floor dysfunction, and this also has been chronic.  Question if maybe she is developing IC.  Follow-up with urology, consider IC diet.   Final Clinical Impressions(s) / UC Diagnoses   Final diagnoses:  PID (acute pelvic inflammatory disease)  Nausea without vomiting  Dysuria     Discharge Instructions      I suspect your symptoms are from developing pelvic inflammatory disease. Your urine sample today does show improvement, there is no urobilinogen present. You were tested today for gonorrhea, chlamydia, trichomonas, BV, and yeast.  We will call you with the results of your test once received. Please avoid all forms of intercourse until test results received, and if positive for any STI, all partners will need to complete entire course of antibiotics prior to resuming. As always, practice safer sexual practices by using protection each and every time, and limiting number of partners.   Please take the antibiotics twice daily for 14 days.  Do not drink any alcohol while on the antibiotics. Also avoid excessive sun exposure.  Your urinary symptoms could be consistent with something called interstitial cystitis.  Please follow-up with the urologist.  Look up the interstitial cystitis diet and try the recommendations.  If you develop fever, vomiting or severe right upper quadrant pain, please head to the ER.     ED Prescriptions     Medication Sig Dispense Auth. Provider   metroNIDAZOLE (FLAGYL) 500 MG tablet Take 1 tablet (500 mg total) by mouth 2 (two) times daily for 14 days. 28 tablet Christen Wardrop L, PA   doxycycline (VIBRAMYCIN) 100 MG capsule Take  1 capsule (100 mg total) by mouth 2 (two) times daily for 14 days. 28 capsule Avel Ogawa L, PA      PDMP not reviewed this encounter.   Chaney Malling, [place] 07/08/22 1857

## 2022-07-08 NOTE — ED Triage Notes (Signed)
Patient c/o urgency and dysuria x 4 days.   Patient denies fever.   Patient endorses mid ABD pain and nausea.   Patient was recently seen at this clinic on 7/10, patient states she was told to come for reevaluation due to urine culture being negative and patient still having symptoms.   Patient has stopped taking ciprofloxacin.

## 2022-07-08 NOTE — Discharge Instructions (Addendum)
I suspect your symptoms are from developing pelvic inflammatory disease. Your urine sample today does show improvement, there is no urobilinogen present. You were tested today for gonorrhea, chlamydia, trichomonas, BV, and yeast.  We will call you with the results of your test once received. Please avoid all forms of intercourse until test results received, and if positive for any STI, all partners will need to complete entire course of antibiotics prior to resuming. As always, practice safer sexual practices by using protection each and every time, and limiting number of partners.   Please take the antibiotics twice daily for 14 days.  Do not drink any alcohol while on the antibiotics. Also avoid excessive sun exposure.  Your urinary symptoms could be consistent with something called interstitial cystitis.  Please follow-up with the urologist.  Look up the interstitial cystitis diet and try the recommendations.  If you develop fever, vomiting or severe right upper quadrant pain, please head to the ER.

## 2022-07-09 ENCOUNTER — Telehealth (HOSPITAL_COMMUNITY): Payer: Self-pay | Admitting: Emergency Medicine

## 2022-07-09 LAB — CERVICOVAGINAL ANCILLARY ONLY
Bacterial Vaginitis (gardnerella): NEGATIVE
Candida Glabrata: NEGATIVE
Candida Vaginitis: POSITIVE — AB
Chlamydia: NEGATIVE
Comment: NEGATIVE
Comment: NEGATIVE
Comment: NEGATIVE
Comment: NEGATIVE
Comment: NEGATIVE
Comment: NORMAL
Neisseria Gonorrhea: NEGATIVE
Trichomonas: NEGATIVE

## 2022-07-09 MED ORDER — FLUCONAZOLE 150 MG PO TABS
150.0000 mg | ORAL_TABLET | Freq: Once | ORAL | 0 refills | Status: AC
Start: 2022-07-09 — End: 2022-07-09

## 2023-01-10 ENCOUNTER — Other Ambulatory Visit: Payer: Self-pay | Admitting: Family Medicine

## 2023-02-06 ENCOUNTER — Other Ambulatory Visit: Payer: Self-pay | Admitting: Family Medicine

## 2023-02-09 ENCOUNTER — Other Ambulatory Visit: Payer: Self-pay | Admitting: Family Medicine

## 2023-02-11 ENCOUNTER — Telehealth: Payer: Self-pay | Admitting: Family Medicine

## 2023-02-11 ENCOUNTER — Ambulatory Visit: Payer: Medicaid Other | Admitting: Family Medicine

## 2023-02-11 NOTE — Telephone Encounter (Signed)
Called patient after she did not show to an appointment this morning. Not able to leave a message on the machine at the number listed in the chart.

## 2023-05-17 ENCOUNTER — Other Ambulatory Visit: Payer: Self-pay | Admitting: Obstetrics & Gynecology

## 2023-05-17 DIAGNOSIS — Z30016 Encounter for initial prescription of transdermal patch hormonal contraceptive device: Secondary | ICD-10-CM

## 2023-07-23 ENCOUNTER — Ambulatory Visit (INDEPENDENT_AMBULATORY_CARE_PROVIDER_SITE_OTHER): Payer: 59 | Admitting: Podiatry

## 2023-07-23 DIAGNOSIS — Z91199 Patient's noncompliance with other medical treatment and regimen due to unspecified reason: Secondary | ICD-10-CM

## 2023-07-23 NOTE — Progress Notes (Signed)
Pt was a no show for apt 

## 2023-07-26 IMAGING — CT CT ABD-PELV W/ CM
2 of 7 series · 14 of 46 positions shown, 16 images · IV contrast (APPLIED)
Comparison: Gastric emptying study January 22, 2021.

CLINICAL DATA: Abdominal pain in a 20-year-old female.

EXAM:
CT ABDOMEN AND PELVIS WITH CONTRAST
TECHNIQUE: Multidetector CT imaging of the abdomen and pelvis was performed
using the standard protocol following bolus administration of
intravenous contrast. Exam compromised by motion on the portal
venous phase due to emesis during the scan.

[Series 4: delay 5.0 br40 1 · axial · delayed · 0.65mm/px · z∈[+846,+1246]mm · 11 of 96 slices shown, 13 images]
[im 8/96  soft-tissue]
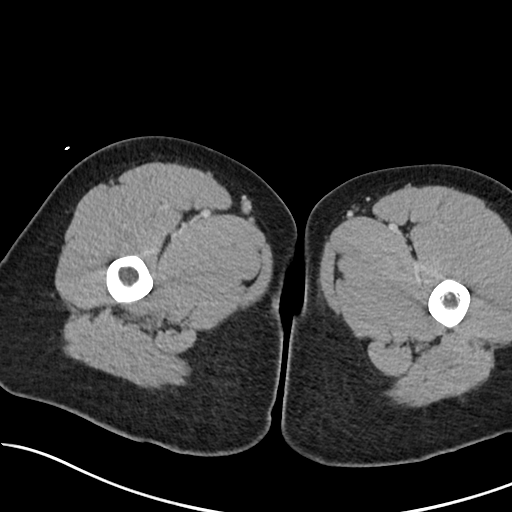
[im 8/96  bone]
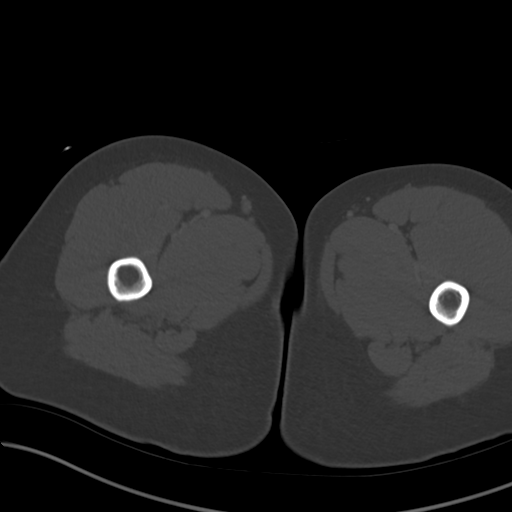
[im 16/96  soft-tissue]
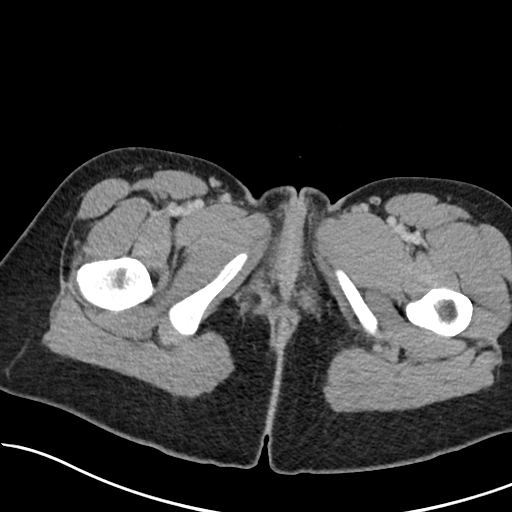
[im 24/96  soft-tissue]
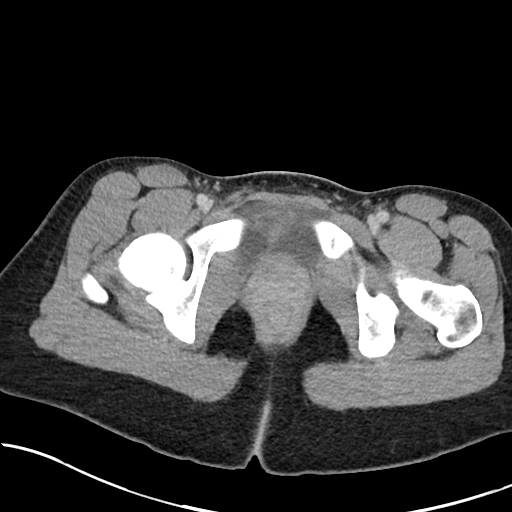
[im 32/96  soft-tissue]
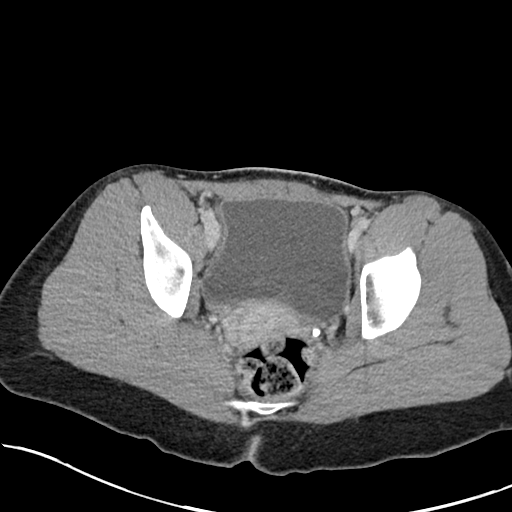
[im 40/96  soft-tissue]
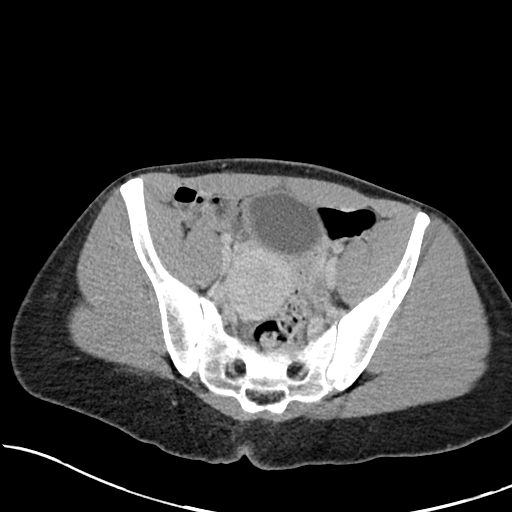
[im 48/96  soft-tissue]
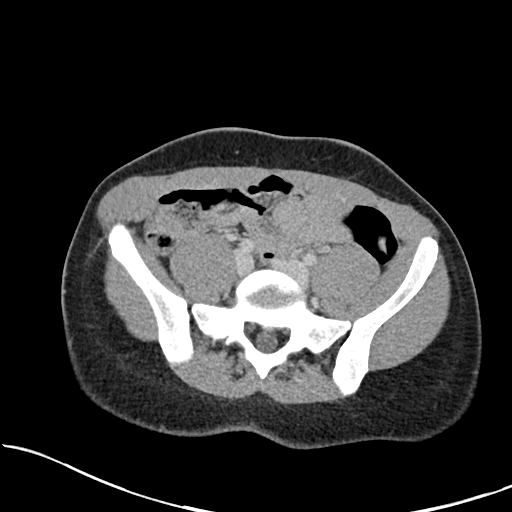
[im 56/96  soft-tissue]
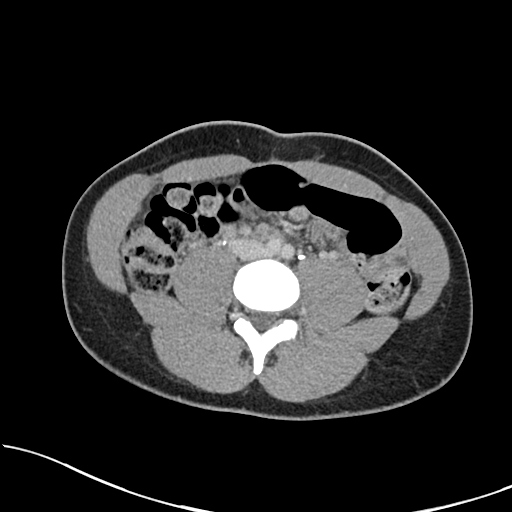
[im 64/96  soft-tissue]
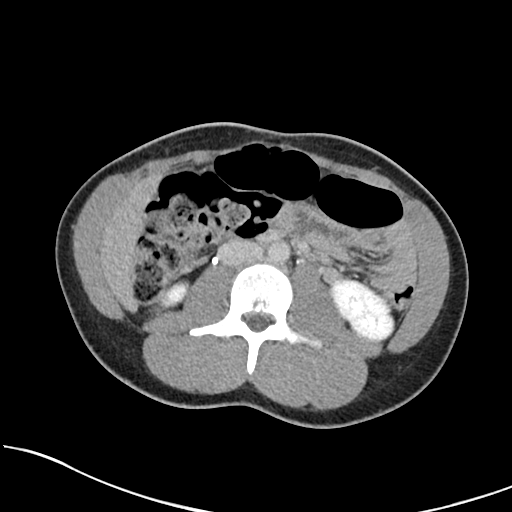
[im 72/96  soft-tissue]
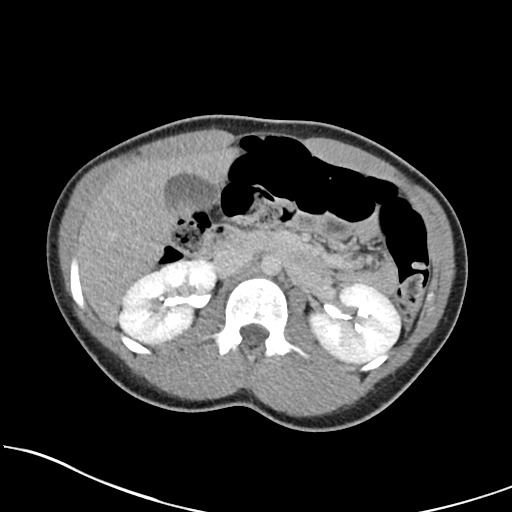
[im 72/96  bone]
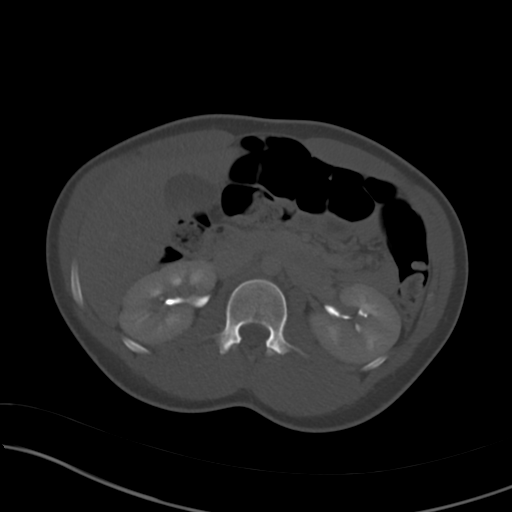
[im 80/96  soft-tissue]
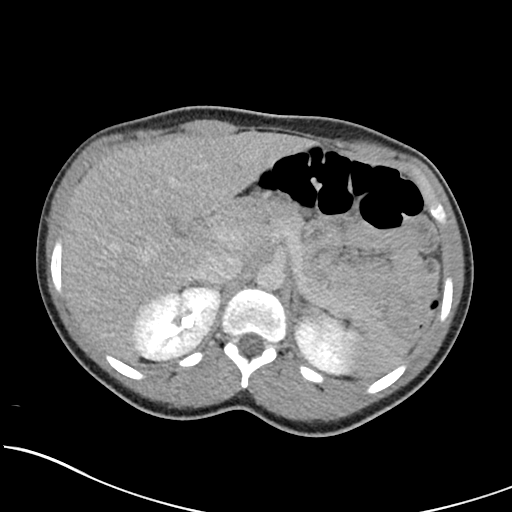
[im 88/96  soft-tissue]
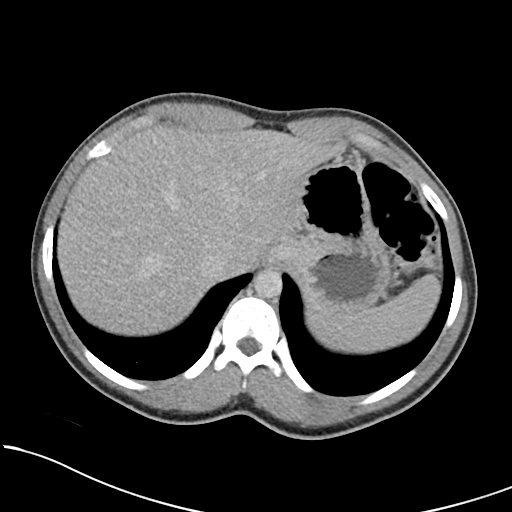

[Series 7: abdomen 3.0 mpr cor · coronal · 0.67mm/px · 3 of 84 slices shown]
[im 17/84  soft-tissue]
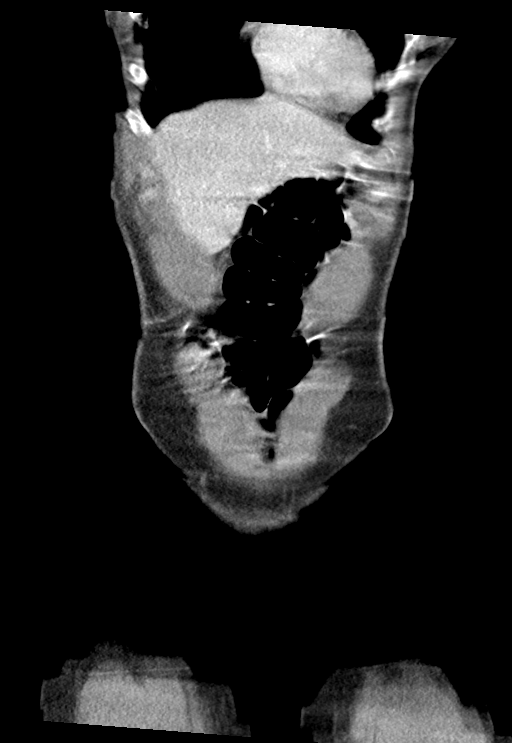
[im 34/84  soft-tissue]
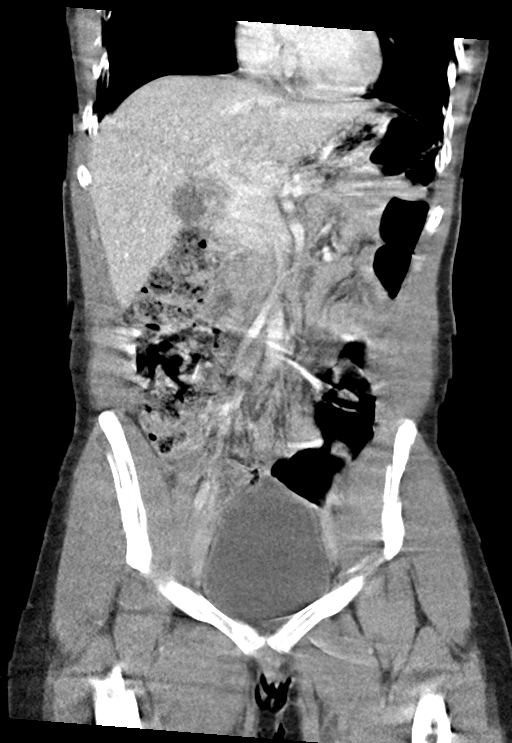
[im 50/84  soft-tissue]
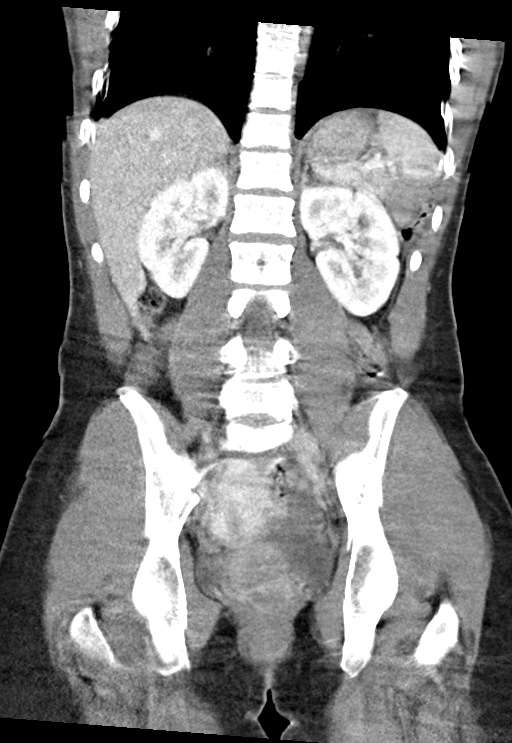

[14 of 46 positions shown; findings below may reference images not displayed]

RADIATION DOSE REDUCTION: This exam was performed according to the
departmental dose-optimization program which includes automated
exposure control, adjustment of the mA and/or kV according to
patient size and/or use of iterative reconstruction technique.

CONTRAST:  100mL OMNIPAQUE IOHEXOL 300 MG/ML  SOLN
FINDINGS: Lower chest: Basilar atelectasis. Motion limited assessment of the
lung bases due to emesis during the scan. No effusion.

Hepatobiliary: No focal, suspicious hepatic lesion. No
pericholecystic stranding. No biliary duct dilation. Portal vein is
patent.

Pancreas: Normal, without mass, inflammation or ductal dilatation.

Spleen: Normal spleen.

Adrenals/Urinary Tract:

Adrenal glands are unremarkable. Symmetric renal enhancement. No
sign of hydronephrosis. No suspicious renal lesion or perinephric
stranding.

Urinary bladder is grossly unremarkable.

Stomach/Bowel: No signs of acute small bowel process. No stranding
adjacent to the stomach.

Moderate stool in the colon mainly in the ascending colon and the
rectum. No pericolonic stranding. Appendix not definitely seen, no
secondary signs to suggest acute appendicitis in the area of the
cecum.

Vascular/Lymphatic:

Aorta with smooth contours. IVC with smooth contours. No aneurysmal
dilation of the abdominal aorta. There is no gastrohepatic or
hepatoduodenal ligament lymphadenopathy. No retroperitoneal or
mesenteric lymphadenopathy.

No pelvic sidewall lymphadenopathy.

Reproductive: Unremarkable by CT.

Other: No free air.  No ascites.

Musculoskeletal: No acute or significant osseous findings.
IMPRESSION: 1. No CT evidence of acute intra-abdominal pathology.
2. Moderate stool in the colon mainly in the ascending colon and the
rectum. Correlate with any clinical evidence of constipation.
3. Appendix not definitely seen, no secondary signs to suggest acute
appendicitis in the area of the cecum.

## 2023-10-06 ENCOUNTER — Other Ambulatory Visit (HOSPITAL_COMMUNITY): Payer: Self-pay | Admitting: Gastroenterology

## 2023-10-06 DIAGNOSIS — R110 Nausea: Secondary | ICD-10-CM

## 2023-10-13 ENCOUNTER — Ambulatory Visit (HOSPITAL_COMMUNITY): Payer: 59

## 2023-10-13 ENCOUNTER — Encounter (HOSPITAL_COMMUNITY): Payer: Self-pay

## 2023-11-17 ENCOUNTER — Emergency Department (HOSPITAL_BASED_OUTPATIENT_CLINIC_OR_DEPARTMENT_OTHER)
Admission: EM | Admit: 2023-11-17 | Discharge: 2023-11-17 | Disposition: A | Discharge status: Home / Self Care | Payer: Medicaid Other | Attending: Emergency Medicine | Admitting: Emergency Medicine

## 2023-11-17 ENCOUNTER — Other Ambulatory Visit: Payer: Self-pay

## 2023-11-17 ENCOUNTER — Emergency Department (HOSPITAL_BASED_OUTPATIENT_CLINIC_OR_DEPARTMENT_OTHER): Payer: Medicaid Other

## 2023-11-17 ENCOUNTER — Encounter (HOSPITAL_BASED_OUTPATIENT_CLINIC_OR_DEPARTMENT_OTHER): Payer: Self-pay | Admitting: Emergency Medicine

## 2023-11-17 DIAGNOSIS — Z794 Long term (current) use of insulin: Secondary | ICD-10-CM | POA: Insufficient documentation

## 2023-11-17 DIAGNOSIS — N939 Abnormal uterine and vaginal bleeding, unspecified: Secondary | ICD-10-CM | POA: Diagnosis present

## 2023-11-17 DIAGNOSIS — E1065 Type 1 diabetes mellitus with hyperglycemia: Secondary | ICD-10-CM | POA: Diagnosis not present

## 2023-11-17 DIAGNOSIS — Z760 Encounter for issue of repeat prescription: Secondary | ICD-10-CM | POA: Insufficient documentation

## 2023-11-17 LAB — CBC
HCT: 38.2 % (ref 36.0–46.0)
Hemoglobin: 13.1 g/dL (ref 12.0–15.0)
MCH: 30.8 pg (ref 26.0–34.0)
MCHC: 34.3 g/dL (ref 30.0–36.0)
MCV: 89.7 fL (ref 80.0–100.0)
Platelets: 327 10*3/uL (ref 150–400)
RBC: 4.26 MIL/uL (ref 3.87–5.11)
RDW: 12.2 % (ref 11.5–15.5)
WBC: 7.8 10*3/uL (ref 4.0–10.5)
nRBC: 0 % (ref 0.0–0.2)

## 2023-11-17 LAB — URINALYSIS, ROUTINE W REFLEX MICROSCOPIC
Bilirubin Urine: NEGATIVE
Glucose, UA: 250 mg/dL — AB
Hgb urine dipstick: NEGATIVE
Ketones, ur: 40 mg/dL — AB
Leukocytes,Ua: NEGATIVE
Nitrite: NEGATIVE
Specific Gravity, Urine: 1.032 — ABNORMAL HIGH (ref 1.005–1.030)
pH: 5.5 (ref 5.0–8.0)

## 2023-11-17 LAB — HCG, QUANTITATIVE, PREGNANCY: hCG, Beta Chain, Quant, S: <1 m[IU]/mL (ref <5)

## 2023-11-17 LAB — PREGNANCY, URINE: Preg Test, Ur: NEGATIVE

## 2023-11-17 LAB — RH IG WORKUP (INCLUDES ABO/RH)
ABO/RH(D): A POS
Gestational Age(Wks): 12

## 2023-11-17 LAB — CBG MONITORING, ED: Glucose-Capillary: 126 mg/dL — ABNORMAL HIGH (ref 70–99)

## 2023-11-17 MED ORDER — ACETAMINOPHEN 500 MG PO TABS
1000.0000 mg | ORAL_TABLET | Freq: Once | ORAL | Status: AC
  Administered 2023-11-17: 1000 mg via ORAL
  Filled 2023-11-17: qty 2

## 2023-11-17 MED ORDER — INSULIN PEN NEEDLE 32G X 4 MM MISC: 1.0000 | 0 refills | Status: AC | PRN

## 2023-11-17 NOTE — ED Notes (Signed)
Pt unable to provide urine at this time. Specimen cup at bedside.

## 2023-11-17 NOTE — Discharge Instructions (Signed)
Your blood work is reassuring, there is no evidence of pregnancy.  This likely represents a heavy menstrual cycle.  Please follow-up with your primary care doctor, and OB/GYN as needed.  Return to the ER if you feel like your symptoms are worsening, or you going through greater than 1 pad an hour.

## 2023-11-17 NOTE — ED Provider Notes (Signed)
[place] EMERGENCY DEPARTMENT AT [facility] Provider Note   CSN: [id number] Arrival date & time: 11/17/23  1419     History  Chief Complaint  Patient presents with   Vaginal Bleeding    Chloe Peters is a 22 y.o. female, hx of DMI, who presents to the ED secondary to vaginal bleeding that began around 2 AM this morning.  She states she started having some back pain, cramping starting a couple days ago, and then woke up.  Today around 2 AM, inserts started having some vaginal bleeding, and large clots that are being passed.  She was at work, helping a patient, when she fell like she is going to pass out, and to have passed a very large clot.  She states that she had her last menstrual period around late July, around July 18, and has had a period since.  And has not had any prenatal care, or OB/GYN care.  Has not had an ultrasound so far.  History of 1 miscarriage prior.     Home Medications Prior to Admission medications   Medication Sig Start Date End Date Taking? Authorizing Provider  Insulin Pen Needle 32G X 4 MM MISC 1 Application by Does not apply route as needed. 11/17/23  Yes Jonte Shiller L, PA  ACCU-CHEK FASTCLIX LANCETS MISC Check sugar 10 x daily 05/24/18   [name], MD  desonide (DESOWEN) 0.05 % ointment APPLY 1 APPLICATION TOPICALLY TWICE A DAY AS NEEDED 01/11/23   Ambs, Norvel Richards, FNP  EPINEPHRINE 0.3 mg/0.3 mL IJ SOAJ injection INJECT 0.3 MG INTO THE MUSCLE AS NEEDED FOR ANAPHYLAXIS. 10/17/21   Hetty Blend, FNP  gabapentin (NEURONTIN) 100 MG capsule Take 100 mg by mouth 3 (three) times daily.    [provider]  Glucagon (BAQSIMI TWO PACK) 3 MG/DOSE POWD Place 1 application into the nose as needed. Use as directed if unconscious, unable to take food po, or having a seizure due to hypoglycemia 10/04/18   [name], MD  glucagon 1 MG injection Inject 1 mg IM into thigh for severe Hypoglycemia and patient unconscious and cannot  eat or drink 07/13/18   [name], MD  glucose blood (ACCU-CHEK GUIDE) test strip Use to check BG 6 times daily 11/14/18   [name], MD  hydrOXYzine (ATARAX/VISTARIL) 25 MG tablet TAKE 1 TABLET BY MOUTH EVERYDAY AT BEDTIME 07/19/20   [provider]  insulin aspart (NOVOLOG) 100 UNIT/ML FlexPen USE AS DIRECTED BY MD, UP TO 50 UNITS DAILY. 03/06/19   [name], MD  insulin degludec (TRESIBA FLEXTOUCH) 100 UNIT/ML SOPN FlexTouch Pen Inject up to 50 units at bedtime and per care plan 11/14/18   [name], MD  linaclotide [facility]) 72 MCG capsule Take 72 mcg by mouth daily before breakfast.    [provider]  loratadine (CLARITIN) 10 MG tablet Take 1 tablet (10 mg total) by mouth daily. 03/10/21   [name], FNP  norelgestromin-ethinyl estradiol [facility]) 150-35 MCG/24HR transdermal patch Place 1 patch onto the skin once a week. 01/22/22   Adam Phenix, MD  Olopatadine HCl 0.2 % SOLN 1 drop in each eye daily as needed 09/04/20   [name], MD  ondansetron (ZOFRAN-ODT) 4 MG disintegrating tablet Take 1 tablet (4 mg total) by mouth every 8 (eight) hours as needed for nausea or vomiting. 04/18/22   Mardella Layman, MD  phenazopyridine (PYRIDIUM) 200 MG tablet Take 1 tablet (200 mg total) by  mouth 3 (three) times daily as needed for pain (urethral spasm). 02/03/22   Brock Bad, MD  polyethylene glycol (MIRALAX) 17 g packet Take 17 g by mouth daily. 04/21/22   Elson Areas, PA-C      Allergies    Shellfish allergy    Review of Systems   Review of Systems  Gastrointestinal:  Positive for abdominal pain. Negative for nausea and vomiting.  Genitourinary:  Positive for vaginal bleeding.    Physical Exam Updated Vital Signs BP 133/75   Pulse (!) 105   Temp 98 F (36.7 C) (Oral)   Resp 16   Ht 5' 3.5" (1.613 m)   Wt 61.2 kg   LMP 07/15/2023 (Approximate)   SpO2 100%   BMI 23.53 kg/m  Physical  Exam Vitals and nursing note reviewed.  Constitutional:      General: She is not in acute distress.    Appearance: She is well-developed.  HENT:     Head: Normocephalic and atraumatic.  Eyes:     Conjunctiva/sclera: Conjunctivae normal.  Cardiovascular:     Rate and Rhythm: Normal rate and regular rhythm.     Heart sounds: No murmur heard. Pulmonary:     Effort: Pulmonary effort is normal. No respiratory distress.     Breath sounds: Normal breath sounds.  Abdominal:     Palpations: Abdomen is soft.     Tenderness: There is no abdominal tenderness.  Musculoskeletal:        General: No swelling.     Cervical back: Neck supple.  Skin:    General: Skin is warm and dry.     Capillary Refill: Capillary refill takes less than 2 seconds.  Neurological:     Mental Status: She is alert.  Psychiatric:        Mood and Affect: Mood normal.     ED Results / Procedures / Treatments   Labs (all labs ordered are listed, but only abnormal results are displayed) Labs Reviewed  URINALYSIS, ROUTINE W REFLEX MICROSCOPIC - Abnormal; Notable for the following components:      Result Value   Specific Gravity, Urine 1.032 (*)    Glucose, UA 250 (*)    Ketones, ur 40 (*)    Protein, ur TRACE (*)    All other components within normal limits  CBG MONITORING, ED - Abnormal; Notable for the following components:   Glucose-Capillary 126 (*)    All other components within normal limits  CBC  HCG, QUANTITATIVE, PREGNANCY  PREGNANCY, URINE  RH IG WORKUP (INCLUDES ABO/RH)    EKG None  Radiology No results found.  Procedures Procedures    Medications Ordered in ED Medications  acetaminophen (TYLENOL) tablet 1,000 mg (has no administration in time range)    ED Course/ Medical Decision Making/ A&P                                 Medical Decision Making Patient is a 22 year old female, here for vaginal bleeding, this been going on for the last day.  States she is having heavy vaginal  bleeding, with some clots.  Is not sure how many pads she is using.  Denies lightheadedness, shortness of breath, or syncopal episodes.  States that she is pregnant, around 2 to 3 months, last menstrual period was 07/15/2023.  We obtain a beta-hCG, urinalysis, CBC, and likely transvaginal ultrasound, for further evaluation  Amount and/or Complexity of Data Reviewed  Labs: ordered.    Details: Beta-hCG negative, hemoglobin stable Discussion of management or test interpretation with external provider(s): Discussed with patient, beta-hCG negative, hemoglobin stable.  Initially transvaginal ultrasound was ordered, and it was actually done prior to pregnancy test, as the nurse had had fetal heart tones.  Ultrasound tech called me, to let me know, no evidence of fetus, and beta-hCG was negative.  No need for transvaginal ultrasound further.  As she has a stable hemoglobin, and this likely represents, a heavy menstrual period, due to several months without a menses.  We discussed follow-up with OB/GYN, and primary care doctor for further evaluation.  She requested that I fill her insulin needles, as she is out.  Discharged home  Risk OTC drugs.    Final Clinical Impression(s) / ED Diagnoses Final diagnoses:  Vaginal bleeding    Rx / DC Orders ED Discharge Orders          Ordered    Insulin Pen Needle 32G X 4 MM MISC  As needed        11/17/23 2000              Pete Pelt, [place] 11/17/23 2007    Gwyneth Sprout, MD 11/22/23 1155

## 2023-11-17 NOTE — ED Triage Notes (Signed)
Pt via pov from home with vaginal bleeding today. Pt states she is 2-3 months pregnant. Pt states she has had significant bleeding with "large pieces of tissue." Pt alert & oriented, nad noted.

## 2024-03-24 ENCOUNTER — Other Ambulatory Visit (HOSPITAL_COMMUNITY): Payer: Self-pay | Admitting: Internal Medicine

## 2024-03-24 DIAGNOSIS — E1143 Type 2 diabetes mellitus with diabetic autonomic (poly)neuropathy: Secondary | ICD-10-CM

## 2024-04-06 ENCOUNTER — Encounter (HOSPITAL_COMMUNITY): Payer: Self-pay

## 2024-04-27 ENCOUNTER — Ambulatory Visit: Payer: Self-pay | Attending: Physician Assistant

## 2024-04-27 VITALS — BP 121/76 | HR 89

## 2024-04-27 DIAGNOSIS — R2681 Unsteadiness on feet: Secondary | ICD-10-CM | POA: Insufficient documentation

## 2024-04-27 DIAGNOSIS — M6281 Muscle weakness (generalized): Secondary | ICD-10-CM | POA: Diagnosis present

## 2024-04-27 DIAGNOSIS — R42 Dizziness and giddiness: Secondary | ICD-10-CM | POA: Insufficient documentation
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
## 2024-05-02 ENCOUNTER — Telehealth: Payer: Self-pay | Admitting: Physical Therapy

## 2024-05-02 ENCOUNTER — Ambulatory Visit: Payer: Self-pay | Admitting: Physical Therapy

## 2024-05-02 NOTE — Telephone Encounter (Signed)
 Tried to call pt re missed PT eval, phone appeared to ring a little, pt did not pick up

## 2024-05-04 ENCOUNTER — Encounter (HOSPITAL_COMMUNITY): Payer: Self-pay

## 2024-05-11 ENCOUNTER — Ambulatory Visit

## 2024-05-11 DIAGNOSIS — R2681 Unsteadiness on feet: Secondary | ICD-10-CM | POA: Diagnosis not present

## 2024-05-11 DIAGNOSIS — R42 Dizziness and giddiness: Secondary | ICD-10-CM

## 2024-05-11 DIAGNOSIS — M6281 Muscle weakness (generalized): Secondary | ICD-10-CM

## 2024-05-11 NOTE — Therapy (Signed)
 OUTPATIENT PHYSICAL THERAPY VESTIBULAR TREATMENT     Patient Name: Chloe Peters. [place] MRN: [id number] DOB:[birth year], 23 y.o., female Today's Date: 05/11/2024  END OF SESSION:  PT End of Session - 05/11/24 1535     Visit Number 2    Number of Visits 5    Date for PT Re-Evaluation 05/26/24    Authorization Type [employer]    PT Start Time 1533   patient late   PT Stop Time 1613    PT Time Calculation (min) 40 min    Activity Tolerance Patient tolerated treatment well    Behavior During Therapy WFL for tasks assessed/performed             Past Medical History:  Diagnosis Date   Eczema    Type 1 diabetes (HCC)    Dx at age 31 years   Past Surgical History:  Procedure Laterality Date   NO PAST SURGERIES     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   Ageusia 10/17/2021   DM w/o complication type I, uncontrolled 05/24/2018    PCP: Dickey Fought, PA REFERRING PROVIDER: Dickey Fought, PA  REFERRING DIAG: H81.90 (ICD-10-CM) - Unspecified disorder of vestibular function, unspecified ear   THERAPY DIAG:  Unsteadiness on feet  Dizziness and giddiness  Muscle weakness (generalized)  ONSET DATE: 04/19/24 referral  Rationale for Evaluation and Treatment: Rehabilitation  SUBJECTIVE:   SUBJECTIVE STATEMENT: Patient reports doing well. BG remains "up and down." 282 currently. Denies falls. Dizziness at 2/5.  Pt accompanied by: self  PERTINENT HISTORY: DMI, previous episodes of vertigo, migraines (typically R sided; last had one ~3-4 months ago)  PAIN:  Are you having pain? No  PRECAUTIONS: None  PATIENT GOALS: "to alleviate some of these symptoms"  OBJECTIVE:  Note: Objective  measures were completed at Evaluation unless otherwise noted.  DIAGNOSTIC FINDINGS: none   VESTIBULAR ASSESSMENT:    POSITIONAL TESTING: Right Dix-Hallpike: no nystagmus and 3/5 dizziness Left Dix-Hallpike: no nystagmus and 4/5 dizziness Right Roll Test: no nystagmus Left Roll Test: no nystagmus  MOTION SENSITIVITY:  Motion Sensitivity Quotient Intensity: 0 = none, 1 = Lightheaded, 2 = Mild, 3 = Moderate, 4 = Severe, 5 = Vomiting  Intensity  1. Sitting to supine 2  2. Supine to L side 1  3. Supine to R side 3  4. Supine to sitting 1  5. L Hallpike-Dix 4  6. Up from L  2  7. R Hallpike-Dix 3  8. Up from R  3  9. Sitting, head tipped to L knee 0  10. Head up from L knee 0  11. Sitting, head tipped to R knee 0  12. Head up from R knee 1  13. Sitting head turns x5 3  14.Sitting head nods x5 3  15. In stance, 180 turn to L  2  16. In stance, 180 turn to R 2  TREATMENT  NMR: -NPC with weight dowels-> tooth picks   -able to progress to 8cm away starting at ~10cm away -brock string x10 reps  -smooth pursuits   -horizontal, vertical, diagonal  -saccades   -horizontal (consistently hypometric to L); vertical (more symptomatic), diagonal (more symptomatic up to L)  -VOR cancellation   -horizontal, vertical, diagonal   -symptoms consistently worse with up and to the L  -VOR x1   -horizontal (difficulty maintaining gaze turning head back to the L), vertical (more symptomatic) -brandt daroff (mild symptoms moving into sidelying)  PATIENT EDUCATION: Education details: initial HEP  Person educated: Patient Education method: Explanation Education comprehension: verbalized understanding and needs further education  HOME EXERCISE PROGRAM: Gaze Stabilization: Sitting    Keeping eyes on target on wall 5 feet away, tilt head down 15-30 and move head side to side  for _30___ seconds. Repeat while moving head up and down for __30__ seconds. Do __3__ sessions per day. Repeat using target on pattern background.  Sit to Side-Lying    Sit on edge of bed. 1. Turn head 45 to right. 2. Maintain head position and lie down slowly on left side. Hold until symptoms subside. 3. Sit up slowly. Hold until symptoms subside. 4. Turn head 45 to left. 5. Maintain head position and lie down slowly on right side. Hold until symptoms subside. 6. Sit up slowly. Repeat sequence ___3_ times per session. Do _3___ sessions per day.  Glenetta Lane string  Access Code: [email or web address] URL: [email or web address] Date: 05/11/2024 Prepared by: Nickola Baron  Exercises - Seated Horizontal Smooth Pursuit  - 1 x daily - 7 x weekly - 3 sets - 30s hold - Seated Horizontal Saccades  - 1 x daily - 7 x weekly - 3 sets - 30s hold - Seated VOR Cancellation  - 1 x daily - 7 x weekly - 3 sets - 30s hold GOALS: Goals reviewed with patient? Yes  SHORT TERM GOALS: = LTG based on PT POC length   LONG TERM GOALS: Target date: 05/26/24  Pt will be independent with final HEP for improved symptoms  Baseline: to be provided Goal status: INITIAL  2.  Patient will improve her DHI score by at least 6 points to demonstrate a reduction in disability related to her dizziness Baseline: 42 (moderate) Goal status: INITIAL    ASSESSMENT:  CLINICAL IMPRESSION: Patient seen for skilled PT session with emphasis on vestibular retraining. PT prescribed initial HEP and patient tolerated well. She does demonstrate increased symptoms with challenge to L vestibular system. She would continue to benefit from further vestibular challenges, especially provoking the L. Continue POC.   OBJECTIVE IMPAIRMENTS: decreased knowledge of condition and dizziness.   ACTIVITY LIMITATIONS: standing, sleeping, stairs, bed mobility, locomotion level, and caring for others  PARTICIPATION LIMITATIONS: interpersonal  relationship, driving, shopping, community activity, occupation, and school  PERSONAL FACTORS: Age, Past/current experiences, Profession, Sex, Social background, Time since onset of injury/illness/exacerbation, and 3+ comorbidities: see above are also affecting patient's functional outcome.   REHAB POTENTIAL: Fair unknown etiology  CLINICAL DECISION MAKING: Unstable/unpredictable  EVALUATION COMPLEXITY: High   PLAN:  PT FREQUENCY: 1x/week  PT DURATION: 4 weeks  PLANNED INTERVENTIONS: 97164- PT Re-evaluation, 97750- Physical Performance Testing, 97110-Therapeutic exercises, 97530- Therapeutic activity, [id number]- Neuromuscular re-education, 97535- Self Care, 36644- Manual therapy, [phone]- Gait training, [phone]- Canalith repositioning, Patient/Family education, Balance training, Stair training, Vestibular training, and Visual/preceptual remediation/compensation  PLAN FOR NEXT SESSION: if she has glasses- do DVA, gaze stabilization, habituation, challenges to L vestibular  system    Rebecca Campus, PT Rebecca Campus, PT, DPT, CBIS  05/11/2024, 4:19 PM

## 2024-05-16 ENCOUNTER — Ambulatory Visit (HOSPITAL_COMMUNITY)
Admission: RE | Admit: 2024-05-16 | Discharge: 2024-05-16 | Disposition: A | Discharge status: Home / Self Care | Source: Ambulatory Visit | Attending: Internal Medicine | Admitting: Internal Medicine

## 2024-05-16 DIAGNOSIS — K3184 Gastroparesis: Secondary | ICD-10-CM | POA: Diagnosis present

## 2024-05-16 DIAGNOSIS — E1143 Type 2 diabetes mellitus with diabetic autonomic (poly)neuropathy: Secondary | ICD-10-CM | POA: Insufficient documentation

## 2024-05-16 MED ORDER — TECHNETIUM TC 99M SULFUR COLLOID
2.0000 | Freq: Once | INTRAVENOUS | Status: AC | PRN
  Administered 2024-05-16: 2 via INTRAVENOUS

## 2024-05-18 ENCOUNTER — Ambulatory Visit: Admitting: Physical Therapy

## 2024-05-25 ENCOUNTER — Ambulatory Visit

## 2024-05-25 DIAGNOSIS — R42 Dizziness and giddiness: Secondary | ICD-10-CM

## 2024-05-25 DIAGNOSIS — M6281 Muscle weakness (generalized): Secondary | ICD-10-CM

## 2024-05-25 DIAGNOSIS — R2681 Unsteadiness on feet: Secondary | ICD-10-CM | POA: Diagnosis not present

## 2024-05-25 NOTE — Therapy (Unsigned)
 OUTPATIENT PHYSICAL THERAPY VESTIBULAR TREATMENT/ RE-CERT     Patient Name: Chloe Peters. [place] MRN: [id number] DOB:[birth year], 23 y.o., female Today's Date: 05/26/2024  END OF SESSION:  PT End of Session - 05/25/24 1531     Visit Number 3    Number of Visits 9   re-cert   Date for PT Re-Evaluation 72/53/66   re-cert   Authorization Type [employer]    PT Start Time 1530    PT Stop Time 1612    PT Time Calculation (min) 42 min    Equipment Utilized During Treatment Gait belt    Activity Tolerance Patient tolerated treatment well    Behavior During Therapy WFL for tasks assessed/performed             Past Medical History:  Diagnosis Date   Eczema    Type 1 diabetes (HCC)    Dx at age 89 years   Past Surgical History:  Procedure Laterality Date   NO PAST SURGERIES     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   Ageusia 10/17/2021   DM w/o complication type I, uncontrolled 05/24/2018    PCP: Dickey Fought, PA REFERRING PROVIDER: Dickey Fought, PA  REFERRING DIAG: H81.90 (ICD-10-CM) - Unspecified disorder of vestibular function, unspecified ear   THERAPY DIAG:  Unsteadiness on feet  Dizziness and giddiness  Muscle weakness (generalized)  ONSET DATE: 04/19/24 referral  Rationale for Evaluation and Treatment: Rehabilitation  SUBJECTIVE:   SUBJECTIVE STATEMENT: Patient reports doing well. BG continues to "rollercoaster." Currently 337, but had 2 lows earlier today. Denies falls. Recently ran out of Reglan and so is more nauseous than typical.  Pt accompanied by: self  PERTINENT HISTORY: DMI, previous episodes of vertigo, migraines (typically R sided; last had one ~3-4 months  ago)  PAIN:  Are you having pain? No  PRECAUTIONS: None  PATIENT GOALS: "to alleviate some of these symptoms"  OBJECTIVE:  Note: Objective measures were completed at Evaluation unless otherwise noted.  DIAGNOSTIC FINDINGS: none   VESTIBULAR ASSESSMENT:  POSITIONAL TESTING: Right Dix-Hallpike: no nystagmus Left Dix-Hallpike: no nystagmus and 3/5 dizziness Right Roll Test: no nystagmus Left Roll Test: no nystagmus  MOTION SENSITIVITY:  Motion Sensitivity Quotient Intensity: 0 = none, 1 = Lightheaded, 2 = Mild, 3 = Moderate, 4 = Severe, 5 = Vomiting  Intensity  1. Sitting to supine 1  2. Supine to L side 1  3. Supine to R side 2  4. Supine to sitting 3  5. L Hallpike-Dix 3  6. Up from L  3  7. R Hallpike-Dix 0  8. Up from R  0  9. Sitting, head tipped to L knee 0  10. Head up from L knee 1  11. Sitting, head tipped to R knee 0  12. Head up from R knee 1  13. Sitting head turns x5 2  14.Sitting head nods x5 0  15. In stance, 180 turn to L  0  16. In stance, 180 turn to R 1  TREATMENT  NMR: -CW/CCW visual tracking ball while walking blue track x2 laps -modified D2 UE retrieve bean bag to place on cone on floor   -dizziness most with looking generally toward upper R quadrant   -reported spinning dizziness after completing 1 lap -VOR x1 horizontal and vertical x30s  -0/5 dizziness H, 1/5 dizziness V -VOR cancellation horizontal   -2/5 dizziness   PATIENT EDUCATION: Education details: continue HEP, PT POC Person educated: Patient Education method: Explanation Education comprehension: verbalized understanding and needs further education  HOME EXERCISE PROGRAM: Gaze Stabilization: Sitting    Keeping eyes on target on wall 5 feet away, tilt head down 15-30 and move head side to side for _30___ seconds. Repeat while moving head up and down for  __30__ seconds. Do __3__ sessions per day. Repeat using target on pattern background.  Sit to Side-Lying    Sit on edge of bed. 1. Turn head 45 to right. 2. Maintain head position and lie down slowly on left side. Hold until symptoms subside. 3. Sit up slowly. Hold until symptoms subside. 4. Turn head 45 to left. 5. Maintain head position and lie down slowly on right side. Hold until symptoms subside. 6. Sit up slowly. Repeat sequence ___3_ times per session. Do _3___ sessions per day.  Glenetta Lane string  Access Code: [email or web address] URL: [email or web address] Date: 05/11/2024 Prepared by: Nickola Baron  Exercises - Seated Horizontal Smooth Pursuit  - 1 x daily - 7 x weekly - 3 sets - 30s hold - Seated Horizontal Saccades  - 1 x daily - 7 x weekly - 3 sets - 30s hold - Seated VOR Cancellation  - 1 x daily - 7 x weekly - 3 sets - 30s hold GOALS: Goals reviewed with patient? Yes  SHORT TERM GOALS: = LTG based on PT POC length   LONG TERM GOALS: Target date: 05/26/24  Pt will be independent with final HEP for improved symptoms  Baseline: to be provided; provided Goal status: MET  2.  Patient will improve her DHI score by at least 6 points to demonstrate a reduction in disability related to her dizziness Baseline: 42 (moderate); 40 (moderate) Goal status: IN PROGRESS  NEW LONG TERM GOALS: TARGET DATE: 06/23/24 Pt will be independent with final HEP for improved dizziness   Baseline: to be updated  Goal status: NEW  2. Patient will improve her DHI score by at least 6 points to demonstrate reduction in dizziness  Baseline: 40 (moderate)  Goal status: NEW  3. Pt will report </= 1/5 for all movements on MSQ to indicate improvement in motion sensitivity and improved activity tolerance.   Baseline: 3/5  Goal status: NEW    ASSESSMENT:  CLINICAL IMPRESSION: Patient seen for skilled PT session with emphasis on goal assessment and re-cert. She met 1/2 LTG, but is  progressing toward the remaining goal. She continues to report at least moderate handicap related to her dizziness. Her MSQ reflects rather significant motion sensitivity, but clear positional assessment. She continues to benefit from skilled PT services to address her dizziness.   OBJECTIVE IMPAIRMENTS: decreased knowledge of condition and dizziness.   ACTIVITY LIMITATIONS: standing, sleeping, stairs, bed mobility, locomotion level, and caring for others  PARTICIPATION LIMITATIONS: interpersonal relationship, driving, shopping, community activity, occupation, and school  PERSONAL FACTORS: Age, Past/current experiences, Profession, Sex, Social background, Time since onset of injury/illness/exacerbation, and 3+ comorbidities: see above are also affecting patient's functional outcome.   REHAB POTENTIAL: Fair unknown etiology  CLINICAL DECISION MAKING: Unstable/unpredictable  EVALUATION COMPLEXITY: High   PLAN:  PT FREQUENCY: 1x/week 1x a week (re-cert)  PT DURATION: 4 weeks 4 weeks (re-cert)  PLANNED INTERVENTIONS: 84696- PT Re-evaluation, 97750- Physical Performance Testing, 97110-Therapeutic exercises, 97530- Therapeutic activity, 97112- Neuromuscular re-education, 97535- Self Care, 29528- Manual therapy, [phone]- Gait training, [phone]- Canalith repositioning, Patient/Family education, Balance training, Stair training, Vestibular training, and Visual/preceptual remediation/compensation  PLAN FOR NEXT SESSION: if she has glasses- do DVA, gaze stabilization, habituation, challenges to L vestibular system    Rebecca Campus, PT Rebecca Campus, PT, DPT, CBIS  05/26/2024, 7:49 AM

## 2024-06-01 ENCOUNTER — Ambulatory Visit: Attending: Physician Assistant

## 2024-06-07 ENCOUNTER — Emergency Department: Admission: EM | Admit: 2024-06-07 | Discharge: 2024-06-07 | Disposition: A | Discharge status: Home / Self Care

## 2024-06-07 ENCOUNTER — Ambulatory Visit: Admitting: Physical Therapy

## 2024-06-07 ENCOUNTER — Other Ambulatory Visit: Payer: Self-pay

## 2024-06-07 DIAGNOSIS — R531 Weakness: Secondary | ICD-10-CM | POA: Insufficient documentation

## 2024-06-07 DIAGNOSIS — E109 Type 1 diabetes mellitus without complications: Secondary | ICD-10-CM | POA: Insufficient documentation

## 2024-06-07 DIAGNOSIS — R42 Dizziness and giddiness: Secondary | ICD-10-CM | POA: Diagnosis present

## 2024-06-07 DIAGNOSIS — R5383 Other fatigue: Secondary | ICD-10-CM | POA: Insufficient documentation

## 2024-06-07 LAB — RESP PANEL BY RT-PCR (RSV, FLU A&B, COVID)  RVPGX2
Influenza A by PCR: NEGATIVE
Influenza B by PCR: NEGATIVE
Resp Syncytial Virus by PCR: NEGATIVE
SARS Coronavirus 2 by RT PCR: NEGATIVE

## 2024-06-07 LAB — CBC WITH DIFFERENTIAL/PLATELET
Abs Immature Granulocytes: 0.02 10*3/uL (ref 0.00–0.07)
Basophils Absolute: 0.1 10*3/uL (ref 0.0–0.1)
Basophils Relative: 1 %
Eosinophils Absolute: 0.5 10*3/uL (ref 0.0–0.5)
Eosinophils Relative: 6 %
HCT: 38.8 % (ref 36.0–46.0)
Hemoglobin: 13.4 g/dL (ref 12.0–15.0)
Immature Granulocytes: 0 %
Lymphocytes Relative: 31 %
Lymphs Abs: 2.5 10*3/uL (ref 0.7–4.0)
MCH: 30.7 pg (ref 26.0–34.0)
MCHC: 34.5 g/dL (ref 30.0–36.0)
MCV: 88.8 fL (ref 80.0–100.0)
Monocytes Absolute: 0.5 10*3/uL (ref 0.1–1.0)
Monocytes Relative: 6 %
Neutro Abs: 4.7 10*3/uL (ref 1.7–7.7)
Neutrophils Relative %: 56 %
Platelets: 355 10*3/uL (ref 150–400)
RBC: 4.37 MIL/uL (ref 3.87–5.11)
RDW: 12.3 % (ref 11.5–15.5)
WBC: 8.3 10*3/uL (ref 4.0–10.5)
nRBC: 0 % (ref 0.0–0.2)

## 2024-06-07 LAB — COMPREHENSIVE METABOLIC PANEL WITH GFR
ALT: 30 U/L (ref 0–44)
AST: 35 U/L (ref 15–41)
Albumin: 3.5 g/dL (ref 3.5–5.0)
Alkaline Phosphatase: 71 U/L (ref 38–126)
Anion gap: 9 (ref 5–15)
BUN: 8 mg/dL (ref 6–20)
CO2: 25 mmol/L (ref 22–32)
Calcium: 9.4 mg/dL (ref 8.9–10.3)
Chloride: 102 mmol/L (ref 98–111)
Creatinine, Ser: 0.66 mg/dL (ref 0.44–1.00)
GFR, Estimated: >60 mL/min (ref >60)
Glucose, Bld: 152 mg/dL — ABNORMAL HIGH (ref 70–99)
Potassium: 3.9 mmol/L (ref 3.5–5.1)
Sodium: 136 mmol/L (ref 135–145)
Total Bilirubin: 0.6 mg/dL (ref 0.0–1.2)
Total Protein: 7.6 g/dL (ref 6.5–8.1)

## 2024-06-07 LAB — HCG, QUANTITATIVE, PREGNANCY: hCG, Beta Chain, Quant, S: 1 m[IU]/mL (ref <5)

## 2024-06-07 MED ORDER — PROCHLORPERAZINE EDISYLATE 10 MG/2ML IJ SOLN
10.0000 mg | Freq: Once | INTRAMUSCULAR | Status: AC
  Administered 2024-06-07: 10 mg via INTRAVENOUS
  Filled 2024-06-07: qty 2

## 2024-06-07 MED ORDER — SODIUM CHLORIDE 0.9 % IV BOLUS
1000.0000 mL | Freq: Once | INTRAVENOUS | Status: AC
  Administered 2024-06-07: 1000 mL via INTRAVENOUS

## 2024-06-07 NOTE — ED Triage Notes (Signed)
 Pt comes in via pov with complaints of lightheadedness, fatigue and weakness for the past few weeks. Pt has a history of vertigo, and is prescribed Reglan, and meclizine. Pt took both medications this morning with no relief. Pt states that this morning she started getting a headache with the vertigo symptoms.

## 2024-06-07 NOTE — Discharge Instructions (Addendum)
 Your evaluation in the emergency department was overall reassuring.  I am unsure as to the exact cause of your lightheadedness, but your symptoms improved with treatment here.  Continue to drink plenty of fluid and follow-up with your primary care doctor for reevaluation.  Return to the emergency department with any new or worsening symptoms.

## 2024-06-07 NOTE — ED Provider Notes (Signed)
 [facility] Provider Note    Event Date/Time   First MD Initiated Contact with Patient 06/07/24 1508     (approximate)   History   Dizziness  Pt comes in via pov with complaints of lightheadedness, fatigue and weakness for the past few weeks. Pt has a history of vertigo, and is prescribed Reglan, and meclizine. Pt took both medications this morning with no relief. Pt states that this morning she started getting a headache with the vertigo symptoms.    HPI Chloe Peters is a 23 y.o. female  pmh T1DM p/w lightheadedness, fatigue -  has some chronic issues with lightheadedness and nausea, takes meclizine and reglan at home. Not relieving symptoms recently. Notes significant fatigue, denies vertigo, denies focal weakness - no recent infectious sx - limited food intake due to decreased appetite but tolerating fluids - no chest pain, shortness of breath, HA - no urinary sx      Physical Exam   Triage Vital Signs: ED Triage Vitals  Encounter Vitals Group     BP 06/07/24 1450 (!) 134/96     Systolic BP Percentile --      Diastolic BP Percentile --      Pulse Rate 06/07/24 1450 (!) 116     Resp 06/07/24 1450 20     Temp 06/07/24 1450 98 F (36.7 C)     Temp Source 06/07/24 1450 Oral     SpO2 06/07/24 1450 98 %     Weight 06/07/24 1442 135 lb (61.2 kg)     Height 06/07/24 1442 5' 3 (1.6 m)     Head Circumference --      Peak Flow --      Pain Score 06/07/24 1442 6     Pain Loc --      Pain Education --      Exclude from Growth Chart --     Most recent vital signs: Vitals:   06/07/24 1450  BP: (!) 134/96  Pulse: (!) 116  Resp: 20  Temp: 98 F (36.7 C)  SpO2: 98%     General: Awake, no distress.  CV:  Good peripheral perfusion. RRR (HR 80s-90s in my eval), RP 2+ Resp:  Normal effort. CTAB Abd:  No distention. Nontender to deep palpation throughout Neuro:  Aox4, CN II-XII intact, FNF wnl, finger taps fast b/l, 5/5 strength in bilateral  finger extension/grip, arm flexion/extension, EHL/FHL. BUE AG 10+ sec no drift, BLE AG 5+ sec no drift. Ambulates with steady gait. SILT. Negative Rhomberg.    ED Results / Procedures / Treatments   Labs (all labs ordered are listed, but only abnormal results are displayed) Labs Reviewed  COMPREHENSIVE METABOLIC PANEL WITH GFR - Abnormal; Notable for the following components:      Result Value   Glucose, Bld 152 (*)    All other components within normal limits  RESP PANEL BY RT-PCR (RSV, FLU A&B, COVID)  [id number]  CBC WITH DIFFERENTIAL/PLATELET  HCG, QUANTITATIVE, PREGNANCY     EKG  See ED course.    RADIOLOGY N/a    PROCEDURES:  Critical Care performed: No  Procedures   MEDICATIONS ORDERED IN ED: Medications  sodium chloride 0.9 % bolus 1,000 mL (1,000 mLs Intravenous New Bag/Given 06/07/24 1553)  prochlorperazine (COMPAZINE) injection 10 mg (10 mg Intravenous Given 06/07/24 1552)     IMPRESSION / MDM / ASSESSMENT AND PLAN / ED COURSE  I reviewed the triage vital signs and the nursing notes.  DDX/MDM/AP: Differential diagnosis includes, but is not limited to, dehydration, viral syndrome including covid-19 or influenza, anemia, DKA, electrolyte abnl, doubt arrhythmia. No e/o CVA on my eval w/ nonfocal neuro exam, no ataxia, primary sx of lightheadedness.   Plan: - labs - ecg - ivf, compazine - no indication for advanced imaging - re-eval  Patient's presentation is most consistent with acute presentation with potential threat to life or bodily function.  ED course below.   Clinical Course as of 06/07/24 1643  Wed Jun 07, 2024  1528 Cmp reviewed, unremarkable [MM]  1529 Cbc wnl [MM]  1529 Ecg = nsr, rate 89, no ste/std, no repol abnl, normal axis, normal intervals. No e/o ischemia, arrhythmia on my read [MM]  1604 Hcg neg [MM]  1640 Patient reevaluated, now she is feeling much better.  No evidence of acute pathology today,  consider possible dehydration contributing to presentation or viral syndrome, COVID/flu/RSV pending, can follow as outpatient.  Recommend PMD follow-up.  Continue home meclizine, Reglan as needed.  ED return precautions in place.  Patient agrees with plan. [MM]    Clinical Course User Index [MM] Collis Deaner, MD     FINAL CLINICAL IMPRESSION(S) / ED DIAGNOSES   Final diagnoses:  Lightheadedness  Fatigue, unspecified type     Rx / DC Orders   ED Discharge Orders     None        Note:  This document was prepared using Dragon voice recognition software and may include unintentional dictation errors.   Collis Deaner, MD 06/07/24 [phone]

## 2024-06-15 ENCOUNTER — Ambulatory Visit

## 2024-06-22 ENCOUNTER — Encounter

## 2024-06-29 ENCOUNTER — Ambulatory Visit: Attending: Physician Assistant | Admitting: Physical Therapy

## 2024-07-10 ENCOUNTER — Ambulatory Visit: Admitting: Obstetrics

## 2024-10-10 ENCOUNTER — Emergency Department
Admission: EM | Admit: 2024-10-10 | Discharge: 2024-10-10 | Disposition: A | Discharge status: Home / Self Care | Payer: Self-pay | Attending: Emergency Medicine | Admitting: Emergency Medicine

## 2024-10-10 ENCOUNTER — Other Ambulatory Visit: Payer: Self-pay

## 2024-10-10 DIAGNOSIS — E109 Type 1 diabetes mellitus without complications: Secondary | ICD-10-CM | POA: Insufficient documentation

## 2024-10-10 DIAGNOSIS — H6122 Impacted cerumen, left ear: Secondary | ICD-10-CM | POA: Insufficient documentation

## 2024-10-10 DIAGNOSIS — R0981 Nasal congestion: Secondary | ICD-10-CM | POA: Insufficient documentation

## 2024-10-10 DIAGNOSIS — H9202 Otalgia, left ear: Secondary | ICD-10-CM

## 2024-10-10 MED ORDER — AMOXICILLIN 875 MG PO TABS: 875.0000 mg | ORAL_TABLET | Freq: Two times a day (BID) | ORAL | 0 refills | Status: AC

## 2024-10-10 MED ORDER — OFLOXACIN 0.3 % OT SOLN: 5.0000 [drp] | Freq: Every day | OTIC | 0 refills | Status: AC

## 2024-10-10 NOTE — ED Provider Notes (Signed)
 [facility] Provider Note    Event Date/Time   First MD Initiated Contact with Patient 10/10/24 1720     (approximate)   History   Otalgia   HPI  Chloe Peters is a 23 y.o. female PMH of T1DM, eczema who presents for evaluation of left ear pain for two days. No fevers. Does endorse nasal congestion and decreased hearing on the left side.        Physical Exam   Triage Vital Signs: ED Triage Vitals  Encounter Vitals Group     BP 10/10/24 1648 130/87     Girls Systolic BP Percentile --      Girls Diastolic BP Percentile --      Boys Systolic BP Percentile --      Boys Diastolic BP Percentile --      Pulse Rate 10/10/24 1648 94     Resp 10/10/24 1648 18     Temp 10/10/24 1648 98.4 F (36.9 C)     Temp Source 10/10/24 1648 Oral     SpO2 10/10/24 1648 100 %     Weight --      Height --      Head Circumference --      Peak Flow --      Pain Score 10/10/24 1651 7     Pain Loc --      Pain Education --      Exclude from Growth Chart --     Most recent vital signs: Vitals:   10/10/24 1648  BP: 130/87  Pulse: 94  Resp: 18  Temp: 98.4 F (36.9 C)  SpO2: 100%   General: Awake, no distress.  CV:  Good peripheral perfusion.  Resp:  Normal effort.  Abd:  No distention.  Other:  Right TM is translucent, left TM not able to visualize due to cerumen impaction.   ED Results / Procedures / Treatments   Labs (all labs ordered are listed, but only abnormal results are displayed) Labs Reviewed - No data to display   PROCEDURES:  Critical Care performed: No  Ear Cerumen Removal  Date/Time: 10/10/2024 5:32 PM  Performed by: Cleaster Tinnie LABOR, PA-C Authorized by: Cleaster Tinnie LABOR, PA-C   Consent:    Consent obtained:  Verbal   Consent given by:  Patient   Risks, benefits, and alternatives were discussed: yes     Risks discussed:  Incomplete removal, infection, dizziness, pain and TM perforation   Alternatives discussed:  No  treatment Universal protocol:    Patient identity confirmed:  Verbally with patient Procedure details:    Location:  L ear   Procedure type: irrigation   Post-procedure details:    Inspection:  Some cerumen remaining and no bleeding   Hearing quality:  Diminished   Procedure completion:  Tolerated well, no immediate complications    MEDICATIONS ORDERED IN ED: Medications - No data to display   IMPRESSION / MDM / ASSESSMENT AND PLAN / ED COURSE  I reviewed the triage vital signs and the nursing notes.                             23 year old female presents for evaluation of left ear pain. VSS and patient NAD on exam.  Differential diagnosis includes, but is not limited to, otitis media, otitis externa, cerumen impaction, viral illness.  Patient's presentation is most consistent with acute, uncomplicated illness.  Unable to visualize left  TM as patient has a cerumen impaction. Discussed doing ear irrigation so I can better evaluate for a middle ear infection. Patient was in agreement.   Ear irrigation was not successful, still unable to see the TM. Cerumen was deep in the ear canal and I had a difficult time visualizing. Did not continue to use currette to remove cerumen as I was concerned about causing a TM perforation. Will cover patient for both otitis media and otitis externa. Advised ENT follow up if pain continues.       FINAL CLINICAL IMPRESSION(S) / ED DIAGNOSES   Final diagnoses:  Otalgia of left ear     Rx / DC Orders   ED Discharge Orders          Ordered    ofloxacin (FLOXIN) 0.3 % OTIC solution  Daily        10/10/24 1743    amoxicillin  (AMOXIL ) 875 MG tablet  2 times daily        10/10/24 1743             Note:  This document was prepared using Dragon voice recognition software and may include unintentional dictation errors.   Cleaster Tinnie LABOR, PA-C 10/10/24 1744    Dorothyann Drivers, MD 10/10/24 2224

## 2024-10-10 NOTE — ED Triage Notes (Signed)
 Pt comes with left ear pain for two days. Pt denies any fevers.

## 2024-10-10 NOTE — Discharge Instructions (Signed)
 Please take and use the antibiotics as prescribed. Return to the ED with any worsening symptoms. If no improvement after antibiotics, follow up with the ear nose and throat specialist attached.

## 2024-10-19 ENCOUNTER — Ambulatory Visit: Payer: Self-pay | Admitting: Neurology

## 2024-10-20 ENCOUNTER — Other Ambulatory Visit: Payer: Self-pay

## 2024-10-20 ENCOUNTER — Encounter: Payer: Self-pay | Admitting: Intensive Care

## 2024-10-20 ENCOUNTER — Emergency Department
Admission: EM | Admit: 2024-10-20 | Discharge: 2024-10-20 | Disposition: A | Discharge status: Home / Self Care | Payer: Self-pay

## 2024-10-20 DIAGNOSIS — X58XXXA Exposure to other specified factors, initial encounter: Secondary | ICD-10-CM | POA: Insufficient documentation

## 2024-10-20 DIAGNOSIS — R7989 Other specified abnormal findings of blood chemistry: Secondary | ICD-10-CM | POA: Insufficient documentation

## 2024-10-20 DIAGNOSIS — S31809A Unspecified open wound of unspecified buttock, initial encounter: Secondary | ICD-10-CM | POA: Insufficient documentation

## 2024-10-20 DIAGNOSIS — Z794 Long term (current) use of insulin: Secondary | ICD-10-CM | POA: Insufficient documentation

## 2024-10-20 DIAGNOSIS — E109 Type 1 diabetes mellitus without complications: Secondary | ICD-10-CM | POA: Insufficient documentation

## 2024-10-20 DIAGNOSIS — L03317 Cellulitis of buttock: Secondary | ICD-10-CM | POA: Insufficient documentation

## 2024-10-20 LAB — CBC WITH DIFFERENTIAL/PLATELET
Abs Immature Granulocytes: 0.03 K/uL (ref 0.00–0.07)
Basophils Absolute: 0.1 K/uL (ref 0.0–0.1)
Basophils Relative: 1 %
Eosinophils Absolute: 0.5 K/uL (ref 0.0–0.5)
Eosinophils Relative: 6 %
HCT: 39.9 % (ref 36.0–46.0)
Hemoglobin: 13.1 g/dL (ref 12.0–15.0)
Immature Granulocytes: 0 %
Lymphocytes Relative: 23 %
Lymphs Abs: 2 K/uL (ref 0.7–4.0)
MCH: 30.8 pg (ref 26.0–34.0)
MCHC: 32.8 g/dL (ref 30.0–36.0)
MCV: 93.7 fL (ref 80.0–100.0)
Monocytes Absolute: 0.5 K/uL (ref 0.1–1.0)
Monocytes Relative: 6 %
Neutro Abs: 5.5 K/uL (ref 1.7–7.7)
Neutrophils Relative %: 64 %
Platelets: 392 K/uL (ref 150–400)
RBC: 4.26 MIL/uL (ref 3.87–5.11)
RDW: 12.2 % (ref 11.5–15.5)
WBC: 8.6 K/uL (ref 4.0–10.5)
nRBC: 0 % (ref 0.0–0.2)

## 2024-10-20 LAB — URINALYSIS, COMPLETE (UACMP) WITH MICROSCOPIC
Bilirubin Urine: NEGATIVE
Glucose, UA: >=500 mg/dL — AB
Ketones, ur: 5 mg/dL — AB
Leukocytes,Ua: NEGATIVE
Nitrite: NEGATIVE
Protein, ur: NEGATIVE mg/dL
Specific Gravity, Urine: 1.016 (ref 1.005–1.030)
pH: 6 (ref 5.0–8.0)

## 2024-10-20 LAB — BLOOD GAS, VENOUS
Acid-Base Excess: 3.4 mmol/L — ABNORMAL HIGH (ref 0.0–2.0)
Bicarbonate: 29.6 mmol/L — ABNORMAL HIGH (ref 20.0–28.0)
O2 Saturation: 57.3 %
Patient temperature: 37
pCO2, Ven: 50 mmHg (ref 44–60)
pH, Ven: 7.38 (ref 7.25–7.43)
pO2, Ven: 35 mmHg (ref 32–45)

## 2024-10-20 LAB — BASIC METABOLIC PANEL WITH GFR
Anion gap: 11 (ref 5–15)
BUN: 9 mg/dL (ref 6–20)
CO2: 25 mmol/L (ref 22–32)
Calcium: 8.9 mg/dL (ref 8.9–10.3)
Chloride: 100 mmol/L (ref 98–111)
Creatinine, Ser: 0.64 mg/dL (ref 0.44–1.00)
GFR, Estimated: >60 mL/min (ref >60)
Glucose, Bld: 237 mg/dL — ABNORMAL HIGH (ref 70–99)
Potassium: 4 mmol/L (ref 3.5–5.1)
Sodium: 136 mmol/L (ref 135–145)

## 2024-10-20 LAB — BETA-HYDROXYBUTYRIC ACID: Beta-Hydroxybutyric Acid: 0.33 mmol/L — ABNORMAL HIGH (ref 0.05–0.27)

## 2024-10-20 LAB — POC URINE PREG, ED: Preg Test, Ur: NEGATIVE

## 2024-10-20 MED ORDER — DOXYCYCLINE HYCLATE 100 MG PO TABS: 100.0000 mg | ORAL_TABLET | Freq: Two times a day (BID) | ORAL | 0 refills | Status: AC

## 2024-10-20 MED ORDER — SODIUM CHLORIDE 0.9 % IV BOLUS
1000.0000 mL | Freq: Once | INTRAVENOUS | Status: AC
  Administered 2024-10-20: 1000 mL via INTRAVENOUS

## 2024-10-20 MED ORDER — DOXYCYCLINE HYCLATE 100 MG PO TABS
100.0000 mg | ORAL_TABLET | Freq: Once | ORAL | Status: AC
  Administered 2024-10-20: 100 mg via ORAL
  Filled 2024-10-20: qty 1

## 2024-10-20 NOTE — ED Provider Notes (Signed)
 [facility] Provider Note    Event Date/Time   First MD Initiated Contact with Patient 10/20/24 1128     (approximate)   History   Rectal Pain   HPI  Chloe Peters is a 23 y.o. female with type 1 diabetes not on insulin  pump who presents with gluteal cleft pain for the past 3 days.  Patient states that she has had pain just above her rectum for the past 3 days and when she asked her hold her significant other to look at it he was concerned that there was a fissure.  She has been placing Vaseline over the area.  She reports taking her insulin  last yesterday evening and has had a few elevated sugars.  Denies any fevers or chills.  She reports that this has happened similarly which resolved with antibiotics in the past.  She does have a primary care physician that she follows regularly with      Physical Exam   Triage Vital Signs: ED Triage Vitals  Encounter Vitals Group     BP 10/20/24 1128 125/88     Girls Systolic BP Percentile --      Girls Diastolic BP Percentile --      Boys Systolic BP Percentile --      Boys Diastolic BP Percentile --      Pulse Rate 10/20/24 1128 94     Resp 10/20/24 1128 16     Temp 10/20/24 1128 98.3 F (36.8 C)     Temp Source 10/20/24 1128 Oral     SpO2 10/20/24 1128 100 %     Weight 10/20/24 1125 135 lb (61.2 kg)     Height 10/20/24 1125 5' 3.5 (1.613 m)     Head Circumference --      Peak Flow --      Pain Score 10/20/24 1125 5     Pain Loc --      Pain Education --      Exclude from Growth Chart --     Most recent vital signs: Vitals:   10/20/24 1128 10/20/24 1438  BP: 125/88 120/80  Pulse: 94 88  Resp: 16 16  Temp: 98.3 F (36.8 C) 98 F (36.7 C)  SpO2: 100% 100%    Nursing Triage Note reviewed. Vital signs reviewed and patients oxygen saturation is normoxic  General: Patient is well nourished, well developed, awake and alert, resting comfortably in no acute distress Head: Normocephalic and  atraumatic Eyes: Normal inspection, extraocular muscles intact, no conjunctival pallor Ear, nose, throat: Normal external exam Neck: Normal range of motion Respiratory: Patient is in no respiratory distress, lungs CTAB Cardiovascular: Patient is not tachycardic, RRR without murmur appreciated GI: Abd SNT with no guarding or rebound  Back: Normal inspection of the back with good strength and range of motion throughout all ext Extremities: pulses intact with good cap refills, no LE pitting edema or calf tenderness Neuro: The patient is alert and oriented to person, place, and time, appropriately conversive, with 5/5 bilat UE/LE strength, no gross motor or sensory defects noted. Coordination appears to be adequate. Skin: Warm, dry, and intact Psych: normal mood and affect, no SI or HI  Rectal exam performed with nurse in the room.  There is no rectal abnormality but superior to the rectum in the gluteal cleft is a linear area of excoriation and surrounding erythema.  ED Results / Procedures / Treatments   Labs (all labs ordered are listed, but only abnormal results  are displayed) Labs Reviewed  BASIC METABOLIC PANEL WITH GFR - Abnormal; Notable for the following components:      Result Value   Glucose, Bld 237 (*)    All other components within normal limits  URINALYSIS, COMPLETE (UACMP) WITH MICROSCOPIC - Abnormal; Notable for the following components:   Color, Urine YELLOW (*)    APPearance HAZY (*)    Glucose, UA >=500 (*)    Hgb urine dipstick LARGE (*)    Ketones, ur 5 (*)    Bacteria, UA RARE (*)    All other components within normal limits  BLOOD GAS, VENOUS - Abnormal; Notable for the following components:   Bicarbonate 29.6 (*)    Acid-Base Excess 3.4 (*)    All other components within normal limits  BETA-HYDROXYBUTYRIC ACID - Abnormal; Notable for the following components:   Beta-Hydroxybutyric Acid 0.33 (*)    All other components within normal limits  AEROBIC CULTURE W  GRAM STAIN (SUPERFICIAL SPECIMEN)  CBC WITH DIFFERENTIAL/PLATELET  POC URINE PREG, ED     EKG None  RADIOLOGY None    PROCEDURES:  Critical Care performed: No  Procedures   MEDICATIONS ORDERED IN ED: Medications  sodium chloride  0.9 % bolus 1,000 mL (0 mLs Intravenous Stopped 10/20/24 1438)  doxycycline  (VIBRA -TABS) tablet 100 mg (100 mg Oral Given 10/20/24 1316)     IMPRESSION / MDM / ASSESSMENT AND PLAN / ED COURSE                                Differential diagnosis includes, but is not limited to, DKA, cellulitis, yeast infection, UTI   ED course: Patient presents with a gluteal cleft infection of some sort.  I do think this is consistent with a cellulitis however she is certainly at risk for fungal infections and a swab was taken knowing that it would likely return positive for skin colonization.  If there is yeast cells arrival antifungal should be initiated.  Workup to determine whether she was in DKA given report of elevated glucoses and lack of insulin  since last night.  She had no acidosis, no significant CO2 deviation or anion gap and I do not think this is consistent with DKA.  Patient was started on doxycycline  and she will follow-up with her primary care physician on Monday.  She was counseled on sitz bath's and to keep the area clean and dry.  All questions answered and patient voiced understanding and requested discharge   Clinical Course as of 10/20/24 2035  Fri Oct 20, 2024  1240 pH, Ven: 7.38 Not acidotic.  [HD]  1254 WBC: 8.6 No leukocytosis [HD]  1334 Beta-Hydroxybutyric Acid(!): 0.33 Slightly elevated however there was no anion gap and she got IV fluids [HD]  1334 Basic metabolic panel(!) No anion gap, no deviation of CO2, not consistent with DKA [HD]  1404 Ketones, ur(!): 5 Bili elevated no evidence of UTI [HD]    Clinical Course User Index [HD] Nicholaus Rolland BRAVO, MD   At time of discharge there is no evidence of acute life, limb, vision,  or fertility threat. Patient has stable vital signs, pain is well controlled, patient is ambulatory and p.o. tolerant.  Discharge instructions were completed using the EPIC system. I would refer you to those at this time. All warnings prescriptions follow-up etc. were discussed in detail with the patient. Patient indicates understanding and is agreeable with this plan. All questions answered.  Patient  is made aware that they may return to the emergency department for any worsening or new condition or for any other emergency.   -- Risk: 5 This patient has a high risk of morbidity due to further diagnostic testing or treatment. Rationale: This patient's evaluation and management involve a high risk of morbidity due to the potential severity of presenting symptoms, need for diagnostic testing, and/or initiation of treatment that may require close monitoring. The differential includes conditions with potential for significant deterioration or requiring escalation of care. Treatment decisions in the ED, including medication administration, procedural interventions, or disposition planning, reflect this level of risk. COPA: 5 The patient has the following acute or chronic illness/injury that poses a possible threat to life or bodily function: [X] : The patient has a potentially serious acute condition or an acute exacerbation of a chronic illness requiring urgent evaluation and management in the Emergency Department. The clinical presentation necessitates immediate consideration of life-threatening or function-threatening diagnoses, even if they are ultimately ruled out.   FINAL CLINICAL IMPRESSION(S) / ED DIAGNOSES   Final diagnoses:  Wound of gluteal cleft, unspecified laterality, initial encounter  Cellulitis of buttock     Rx / DC Orders   ED Discharge Orders          Ordered    doxycycline  (VIBRA -TABS) 100 MG tablet  2 times daily        10/20/24 1321             Note:  This  document was prepared using Dragon voice recognition software and may include unintentional dictation errors.   Nicholaus Rolland BRAVO, MD 10/20/24 2035

## 2024-10-20 NOTE — Discharge Instructions (Signed)
 You were seen in the emergency department for gluteal cleft wound.  Stop placing Vaseline in the area.  Keep the area dry and clean.  Twice a day take a sitz bath to make sure you dry completely afterwards.  Take your antibiotic as prescribed.  Follow-up with your primary care physician on Monday and discuss whether you will require referral to wound clinic.  Return if any acutely worsening symptoms or any other emergency.  Continue to monitor your glucose. -- RETURN PRECAUTIONS & AFTERCARE: (ENGLISH) RETURN PRECAUTIONS: Return immediately to the emergency department or see/call your doctor if you feel worse, weak or have changes in speech or vision, are short of breath, have fever, vomiting, pain, bleeding or dark stool, trouble urinating or any new issues. Return here or see/call your doctor if not improving as expected for your suspected condition. FOLLOW-UP CARE: Call your doctor and/or any doctors we referred you to for more advice and to make an appointment. Do this today, tomorrow or after the weekend. Some doctors only take PPO insurance so if you have HMO insurance you may want to contact your HMO or your regular doctor for referral to a specialist within your plan. Either way tell the doctor's office that it was a referral from the emergency department so you get the soonest possible appointment.  YOUR TEST RESULTS: Take result reports of any blood or urine tests, imaging tests and EKG's to your doctor and any referral doctor. Have any abnormal tests repeated. Your doctor or a referral doctor can let you know when this should be done. Also make sure your doctor contacts this hospital to get any test results that are not currently available such as cultures or special tests for infection and final imaging reports, which are often not available at the time you leave the ER but which may list additional important findings that are not documented on the preliminary report. BLOOD PRESSURE: If your blood  pressure was greater than 120/80 have your blood pressure rechecked within 1 to 2 weeks. MEDICATION SIDE EFFECTS: Do not drive, walk, bike, take the bus, etc. if you have received or are being prescribed any sedating medications such as those for pain or anxiety or certain antihistamines like Benadryl. If you have been give one of these here get a taxi home or have a friend drive you home. Ask your pharmacist to counsel you on potential side effects of any new medication

## 2024-10-20 NOTE — ED Triage Notes (Signed)
 Patient c/o rectal pain. Reports she had her boyfriend look and appears to be a tear.   Denies rectal bleeding

## 2024-10-23 ENCOUNTER — Other Ambulatory Visit: Payer: Self-pay

## 2024-10-23 ENCOUNTER — Ambulatory Visit: Admitting: Obstetrics

## 2024-10-23 ENCOUNTER — Emergency Department
Admission: EM | Admit: 2024-10-23 | Discharge: 2024-10-23 | Disposition: A | Discharge status: Home / Self Care | Payer: Self-pay | Attending: Emergency Medicine | Admitting: Emergency Medicine

## 2024-10-23 DIAGNOSIS — Z4800 Encounter for change or removal of nonsurgical wound dressing: Secondary | ICD-10-CM | POA: Insufficient documentation

## 2024-10-23 DIAGNOSIS — E109 Type 1 diabetes mellitus without complications: Secondary | ICD-10-CM | POA: Insufficient documentation

## 2024-10-23 DIAGNOSIS — Z5189 Encounter for other specified aftercare: Secondary | ICD-10-CM

## 2024-10-23 LAB — AEROBIC CULTURE W GRAM STAIN (SUPERFICIAL SPECIMEN): Gram Stain: NONE SEEN

## 2024-10-23 NOTE — ED Provider Notes (Signed)
 [facility] Provider Note    Event Date/Time   First MD Initiated Contact with Patient 10/23/24 1118     (approximate)   History   Wound Check   HPI  Chloe Peters is a 23 y.o. female who presents today for re-evaluation of cellulitis.  Patient reports that she was seen a couple of days ago and was recommended that she have this area reevaluated.  She reports that overall she feels improvement.  There is no drainage.  No fevers or chills.  She reports that she still has mild discomfort with sitting.  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   Ageusia 10/17/2021   DM w/o complication type I, uncontrolled 05/24/2018          Physical Exam   Triage Vital Signs: ED Triage Vitals  Encounter Vitals Group     BP 10/23/24 1104 (!) 125/95     Girls Systolic BP Percentile --      Girls Diastolic BP Percentile --      Boys Systolic BP Percentile --      Boys Diastolic BP Percentile --      Pulse Rate 10/23/24 1104 97     Resp 10/23/24 1104 16     Temp 10/23/24 1104 98.6 F (37 C)     Temp src --      SpO2 10/23/24 1104 100 %     Weight 10/23/24 1105 134 lb 14.7 oz (61.2 kg)     Height --      Head Circumference --      Peak Flow --      Pain Score 10/23/24 1105 0     Pain Loc --      Pain Education --      Exclude from Growth Chart --     Most recent vital signs: Vitals:   10/23/24 1104  BP: (!) 125/95  Pulse: 97  Resp: 16  Temp: 98.6 F (37 C)  SpO2: 100%    Physical Exam Vitals and nursing note reviewed.  Constitutional:      General: Awake and alert. No acute distress.    Appearance: Normal appearance. The patient is normal weight.  HENT:      Head: Normocephalic and atraumatic.     Mouth: Mucous membranes are moist.  Eyes:     General: PERRL. Normal EOMs        Right eye: No discharge.        Left eye: No discharge.     Conjunctiva/sclera: Conjunctivae normal.  Cardiovascular:     Rate and Rhythm: Normal rate and regular rhythm.     Pulses: Normal pulses.  Pulmonary:     Effort: Pulmonary effort is normal. No respiratory distress.     Breath sounds: Normal breath sounds.  Abdominal:     Abdomen is soft. There is no abdominal tenderness. No rebound or guarding. No distention. There is a superficial abrasion that is linear at the superior portion of the gluteal cleft.  No surrounding erythema.  No discharge.  No swelling or fluctuance. Musculoskeletal:        General: No swelling. Normal range of motion.     Cervical back: Normal  range of motion and neck supple.  Skin:    General: Skin is warm and dry.     Capillary Refill: Capillary refill takes less than 2 seconds.     Findings: No rash.  Neurological:     Mental Status: The patient is awake and alert.      ED Results / Procedures / Treatments   Labs (all labs ordered are listed, but only abnormal results are displayed) Labs Reviewed - No data to display   EKG     RADIOLOGY     PROCEDURES:  Critical Care performed:   Procedures   MEDICATIONS ORDERED IN ED: Medications - No data to display   IMPRESSION / MDM / ASSESSMENT AND PLAN / ED COURSE  I reviewed the triage vital signs and the nursing notes.   Differential diagnosis includes, but is not limited to, fissure, cellulitis, abrasion.  I reviewed the patient's chart.  Patient was seen in the emergency department 3 days ago with the same complaint.  At that time she had blood work that revealed hyperglycemia.  She was started on doxycycline  for presumed cellulitis and counseled on sitz bath's and to keep the area clean and dry.  I reviewed her culture results.  She has multiple organisms  present, nonpredominant.  No group A strep, and no Staphylococcus.  There is no surrounding erythema on exam today.  There are no rectal abnormalities, no abscess noted.  She appears to have a superficial linear abrasion type injury to her superior portion of her gluteal cleft only.  This appears to be improved from prior, and overall she is symptomatically improved.  I recommended outpatient follow-up for reevaluation.  We discussed care of this area and strict return precautions.  She understands that she needs to follow-up with the primary care provider regarding her hyperglycemia as well.  She is overall nontoxic in appearance today.  All questions were answered and she was discharged in stable condition.  Patient's presentation is most consistent with acute complicated illness / injury requiring diagnostic workup.    FINAL CLINICAL IMPRESSION(S) / ED DIAGNOSES   Final diagnoses:  Visit for wound check     Rx / DC Orders   ED Discharge Orders     None        Note:  This document was prepared using Dragon voice recognition software and may include unintentional dictation errors.   Aasiyah Auerbach E, PA-C 10/23/24 1422    Levander Slate, MD 10/23/24 [phone]

## 2024-10-23 NOTE — Discharge Instructions (Signed)
 Your wound appears to be healing appropriately.  You may continue to take the antibiotics.  Please follow-up with gastroenterology for continued management and recheck.  Please return for any new, worsening, or changing symptoms or other concerns.  It was a pleasure caring for you today.

## 2024-10-23 NOTE — ED Triage Notes (Signed)
 Seen Friday and diagnosed with cellulitis.  Unable to follow up with PCP today, so here for follow up.

## 2024-11-05 ENCOUNTER — Emergency Department
Admission: EM | Admit: 2024-11-05 | Discharge: 2024-11-05 | Disposition: A | Discharge status: Home / Self Care | Payer: Self-pay

## 2024-11-05 ENCOUNTER — Other Ambulatory Visit: Payer: Self-pay

## 2024-11-05 ENCOUNTER — Encounter: Payer: Self-pay | Admitting: Emergency Medicine

## 2024-11-05 DIAGNOSIS — E109 Type 1 diabetes mellitus without complications: Secondary | ICD-10-CM | POA: Insufficient documentation

## 2024-11-05 DIAGNOSIS — N3000 Acute cystitis without hematuria: Secondary | ICD-10-CM | POA: Insufficient documentation

## 2024-11-05 LAB — URINALYSIS, ROUTINE W REFLEX MICROSCOPIC
Bacteria, UA: NONE SEEN
Bilirubin Urine: NEGATIVE
Glucose, UA: NEGATIVE mg/dL
Hgb urine dipstick: NEGATIVE
Ketones, ur: NEGATIVE mg/dL
Nitrite: NEGATIVE
Protein, ur: NEGATIVE mg/dL
Specific Gravity, Urine: 1.013 (ref 1.005–1.030)
pH: 7 (ref 5.0–8.0)

## 2024-11-05 LAB — POC URINE PREG, ED: Preg Test, Ur: NEGATIVE

## 2024-11-05 MED ORDER — CIPROFLOXACIN HCL 500 MG PO TABS
500.0000 mg | ORAL_TABLET | Freq: Once | ORAL | Status: AC
  Administered 2024-11-05: 500 mg via ORAL
  Filled 2024-11-05: qty 1

## 2024-11-05 MED ORDER — PHENAZOPYRIDINE HCL 100 MG PO TABS: 100.0000 mg | ORAL_TABLET | Freq: Three times a day (TID) | ORAL | 0 refills | Status: AC | PRN

## 2024-11-05 MED ORDER — CIPROFLOXACIN HCL 500 MG PO TABS
500.0000 mg | ORAL_TABLET | Freq: Two times a day (BID) | ORAL | 0 refills | Status: AC
Start: 2024-11-05 — End: 2024-11-12

## 2024-11-05 NOTE — ED Triage Notes (Signed)
 Patient arrives ambulatory by POV concerned for UTI. Reports burning urination and frequency x 2 days.

## 2024-11-05 NOTE — Discharge Instructions (Addendum)
 You have been seen in the Emergency Department (ED) today for pain when urinating.  Your workup today suggests that you have a urinary tract infection (UTI).  Please take your antibiotic as prescribed and over-the-counter pain medication (Tylenol  or Motrin) as needed, but no more than recommended on the label instructions.  Drink PLENTY of fluids.  Call your regular doctor or urologist to schedule the next available appointment to follow up on today's ED visit, or return immediately to the ED if your pain worsens, you have decreased urine production, develop fever, persistent vomiting, or other symptoms that concern you.

## 2024-11-05 NOTE — ED Provider Notes (Signed)
 [facility] Provider Note    Event Date/Time   First MD Initiated Contact with Patient 11/05/24 1520     (approximate)   History   No chief complaint on file.   HPI  Chloe Peters is a 23 y.o. female  with a past medical history of type 1 DM, eczema, interstitial cystitis, pelvic floor dysfunction presents to the emergency department with dysuria, urinary urgency and increased urinary frequency x 2 days.  Reports she sees urology roughly 1-2 times a month and has been placed on prophylactic Keflex for the past 6 to 7 months for her interstitial cystitis.  Is unsure of the provider's name.  Reports when she usually gets a UTI, she takes ciprofloxacin  and this takes care of the infection.  Last blood glucose was completed at home this morning and in the 200s which she claims is her norm. Monitors her blood sugars daily. Denies fever, back pain, abdominal pain, hematuria, chest pain, shortness of breath, vomiting, diarrhea, abnormal vaginal discharge or bleeding.  Denies polyuria or polydipsia.    Physical Exam   Triage Vital Signs: ED Triage Vitals  Encounter Vitals Group     BP 11/05/24 1403 131/87     Girls Systolic BP Percentile --      Girls Diastolic BP Percentile --      Boys Systolic BP Percentile --      Boys Diastolic BP Percentile --      Pulse Rate 11/05/24 1403 (!) 102     Resp 11/05/24 1403 16     Temp 11/05/24 1403 98.3 F (36.8 C)     Temp Source 11/05/24 1403 Oral     SpO2 11/05/24 1403 99 %     Weight 11/05/24 1402 135 lb (61.2 kg)     Height 11/05/24 1402 5' 3.5 (1.613 m)     Head Circumference --      Peak Flow --      Pain Score 11/05/24 1402 0     Pain Loc --      Pain Education --      Exclude from Growth Chart --     Most recent vital signs: Vitals:   11/05/24 1521 11/05/24 1707  BP:    Pulse:  96  Resp:    Temp:    SpO2: 99%     General: Awake, in no acute distress. Appears stated age. CV: Good peripheral  perfusion.  Respiratory:Normal respiratory effort.  No respiratory distress. CTAB. GI: Soft, non-distended, non-tender. No rebound or guarding. Skin:Warm, dry, intact. No rashes, lesions, or ecchymosis.  No midline lumbar tenderness or CVA tenderness b/l.  ED Results / Procedures / Treatments   Labs (all labs ordered are listed, but only abnormal results are displayed) Labs Reviewed  URINALYSIS, ROUTINE W REFLEX MICROSCOPIC - Abnormal; Notable for the following components:      Result Value   Color, Urine YELLOW (*)    APPearance CLEAR (*)    Leukocytes,Ua SMALL (*)    All other components within normal limits  URINE CULTURE  POC URINE PREG, ED     EKG     RADIOLOGY    PROCEDURES:  Critical Care performed: No   Procedures   MEDICATIONS ORDERED IN ED: Medications  ciprofloxacin  (CIPRO ) tablet 500 mg (500 mg Oral Given 11/05/24 1655)     IMPRESSION / MDM / ASSESSMENT AND PLAN / ED COURSE  I reviewed the triage vital signs and the nursing notes.  Differential diagnosis includes, but is not limited to, UTI, interstitial cystitis, less likely pyelonephritis, dehydration, glucosuria   Patient's presentation is most consistent with acute complicated illness / injury requiring diagnostic workup.  Clinical Course as of 11/05/24 1717  Sun Nov 05, 2024  1542 Patient here with urinary symptoms.  UA and UPT ordered in triage.  UPT is negative.  She does have a history of interstitial cystitis and takes prophylactic Keflex for the past 6 months.  She is afebrile and nontoxic-appearing without any abdominal or back pain or tenderness on exam.  Pulse is elevated at 102.  Will have her drink p.o. here and repeat pulse afterwards.  Reports she took her blood sugar this morning was in the 200s.  She is not having any polydipsia or polyphasia. [SD]  1607 UA with small leukocytes. UPT negative. [SD]  1628 Urine without any glucosuria.  Will treat for UTI  with 1 dose ciprofloxacin  here and send prescription to pharmacy.  Awaiting repeat pulse results after p.o. water given. [SD]  1713 Repeat heart rate is 96 bpm.  From past visits, it looks like her resting heart rate is within the 80s to 90s.  Will also give her a Pyridium  prescription as requested. Urine culture pending.  Will have her follow-up with urology outpatient given her hx of interstitial cystitis and hx type 1 diabetes. [SD]    Clinical Course User Index [SD] Sheron Salm, PA-C   The patient may return to the emergency department for any new, worsening, or concerning symptoms. Patient was given the opportunity to ask questions; all questions were answered. Emergency department return precautions were discussed with the patient.  Patient is in agreement to the treatment plan.  Patient is stable for discharge.   FINAL CLINICAL IMPRESSION(S) / ED DIAGNOSES   Final diagnoses:  Acute cystitis without hematuria     Rx / DC Orders   ED Discharge Orders          Ordered    ciprofloxacin  (CIPRO ) 500 MG tablet  2 times daily        11/05/24 1632    phenazopyridine  (PYRIDIUM ) 100 MG tablet  3 times daily PRN        11/05/24 1708             Note:  This document was prepared using Dragon voice recognition software and may include unintentional dictation errors.     Sheron Salm, PA-C 11/05/24 1717    Nicholaus Rolland BRAVO, MD 11/05/24 2119

## 2024-11-08 LAB — URINE CULTURE: Culture: 40000 — AB

## 2025-01-02 ENCOUNTER — Encounter: Payer: Self-pay | Admitting: Neurology

## 2025-01-02 ENCOUNTER — Telehealth: Payer: Self-pay | Admitting: Neurology

## 2025-01-02 ENCOUNTER — Ambulatory Visit: Payer: Self-pay | Admitting: Neurology

## 2025-01-02 NOTE — Telephone Encounter (Signed)
 Patient cancelled appointment due to [facility] do not accept insurance.

## 2025-01-28 ENCOUNTER — Encounter (HOSPITAL_BASED_OUTPATIENT_CLINIC_OR_DEPARTMENT_OTHER): Payer: Self-pay

## 2025-01-29 ENCOUNTER — Ambulatory Visit: Admitting: Obstetrics
